# Patient Record
Sex: Female | Born: 1970 | Race: White | Hispanic: No | Marital: Single | Smoking: Current every day smoker
Health system: Southern US, Community
[De-identification: ages and names within clinical notes are randomized; demographics above are authoritative.]

## PROBLEM LIST (undated history)

## (undated) DIAGNOSIS — R519 Headache, unspecified: Secondary | ICD-10-CM

## (undated) DIAGNOSIS — K649 Unspecified hemorrhoids: Secondary | ICD-10-CM

## (undated) DIAGNOSIS — R600 Localized edema: Secondary | ICD-10-CM

## (undated) DIAGNOSIS — Z915 Personal history of self-harm: Secondary | ICD-10-CM

## (undated) DIAGNOSIS — K589 Irritable bowel syndrome without diarrhea: Secondary | ICD-10-CM

## (undated) DIAGNOSIS — I8390 Asymptomatic varicose veins of unspecified lower extremity: Secondary | ICD-10-CM

## (undated) DIAGNOSIS — M79673 Pain in unspecified foot: Secondary | ICD-10-CM

## (undated) DIAGNOSIS — Z9151 Personal history of suicidal behavior: Secondary | ICD-10-CM

## (undated) DIAGNOSIS — K219 Gastro-esophageal reflux disease without esophagitis: Secondary | ICD-10-CM

## (undated) DIAGNOSIS — F419 Anxiety disorder, unspecified: Secondary | ICD-10-CM

## (undated) DIAGNOSIS — A6000 Herpesviral infection of urogenital system, unspecified: Secondary | ICD-10-CM

## (undated) DIAGNOSIS — K4090 Unilateral inguinal hernia, without obstruction or gangrene, not specified as recurrent: Secondary | ICD-10-CM

## (undated) HISTORY — DX: Anxiety disorder, unspecified: F41.9

## (undated) HISTORY — DX: Unspecified hemorrhoids: K64.9

## (undated) HISTORY — DX: Gastro-esophageal reflux disease without esophagitis: K21.9

## (undated) HISTORY — DX: Asymptomatic varicose veins of unspecified lower extremity: I83.90

## (undated) HISTORY — DX: Irritable bowel syndrome without diarrhea: K58.9

## (undated) HISTORY — PX: BREAST BIOPSY: SHX20

## (undated) HISTORY — DX: Irritable bowel syndrome, unspecified: K58.9

## (undated) HISTORY — DX: Unilateral inguinal hernia, without obstruction or gangrene, not specified as recurrent: K40.90

---
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
## 1999-05-01 HISTORY — PX: VARICOSE VEIN SURGERY: SHX832

## 2009-04-30 DIAGNOSIS — K4090 Unilateral inguinal hernia, without obstruction or gangrene, not specified as recurrent: Secondary | ICD-10-CM

## 2009-04-30 HISTORY — PX: ABDOMINAL HYSTERECTOMY: SHX81

## 2009-04-30 HISTORY — DX: Unilateral inguinal hernia, without obstruction or gangrene, not specified as recurrent: K40.90

## 2009-08-30 ENCOUNTER — Ambulatory Visit: Payer: Self-pay | Admitting: General Surgery

## 2009-08-30 HISTORY — PX: HERNIA REPAIR: SHX51

## 2009-09-04 ENCOUNTER — Inpatient Hospital Stay: Payer: Self-pay | Admitting: Obstetrics & Gynecology

## 2009-10-01 ENCOUNTER — Emergency Department: Payer: Self-pay | Admitting: Internal Medicine

## 2009-10-17 ENCOUNTER — Emergency Department: Payer: Self-pay | Admitting: Internal Medicine

## 2012-02-11 IMAGING — RF DG SMALL BOWEL
1 series · 4 of 4 positions shown · non-contrast
Comparison: none

REASON FOR EXAM: Small bowel obstruction, limited study to assess small
bowel.
COMMENTS:

[Series 1: run · 4 of 4 slices shown]
[im 1/4]
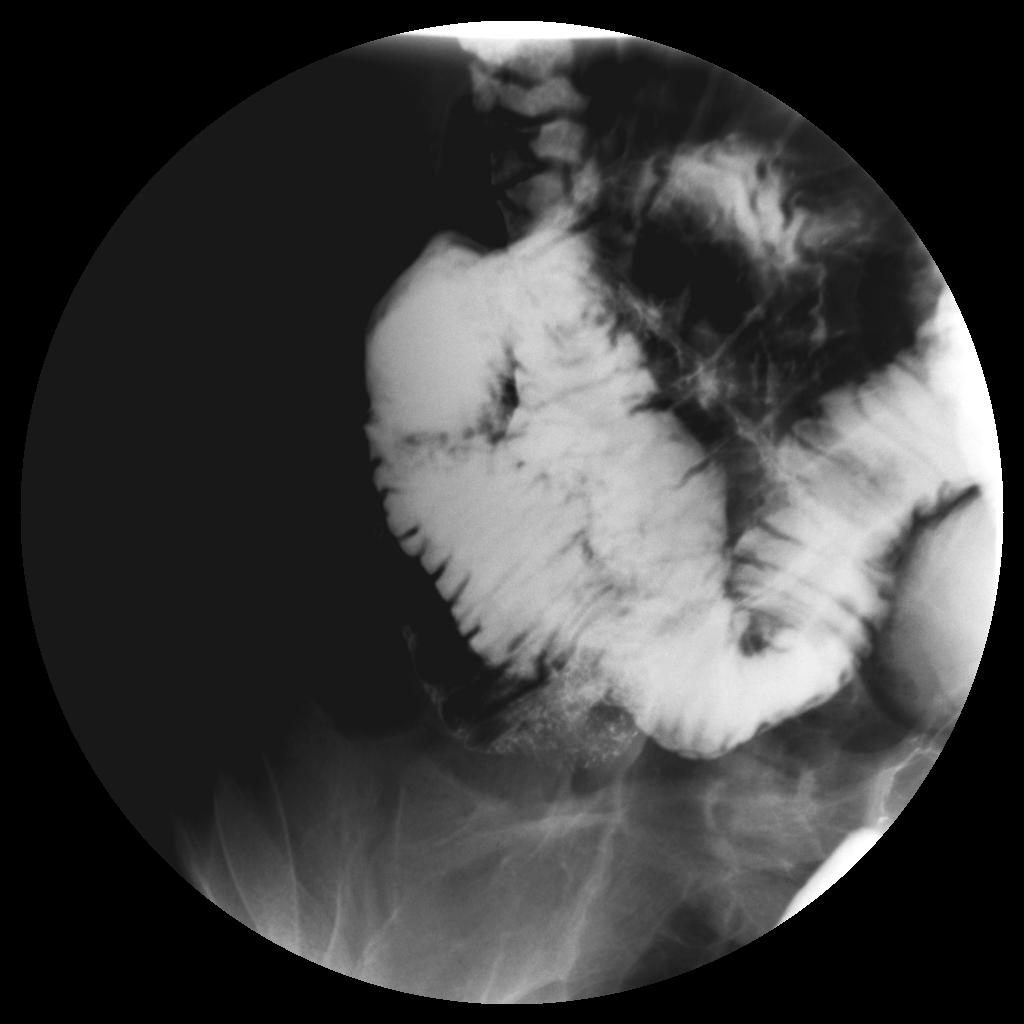
[im 2/4]
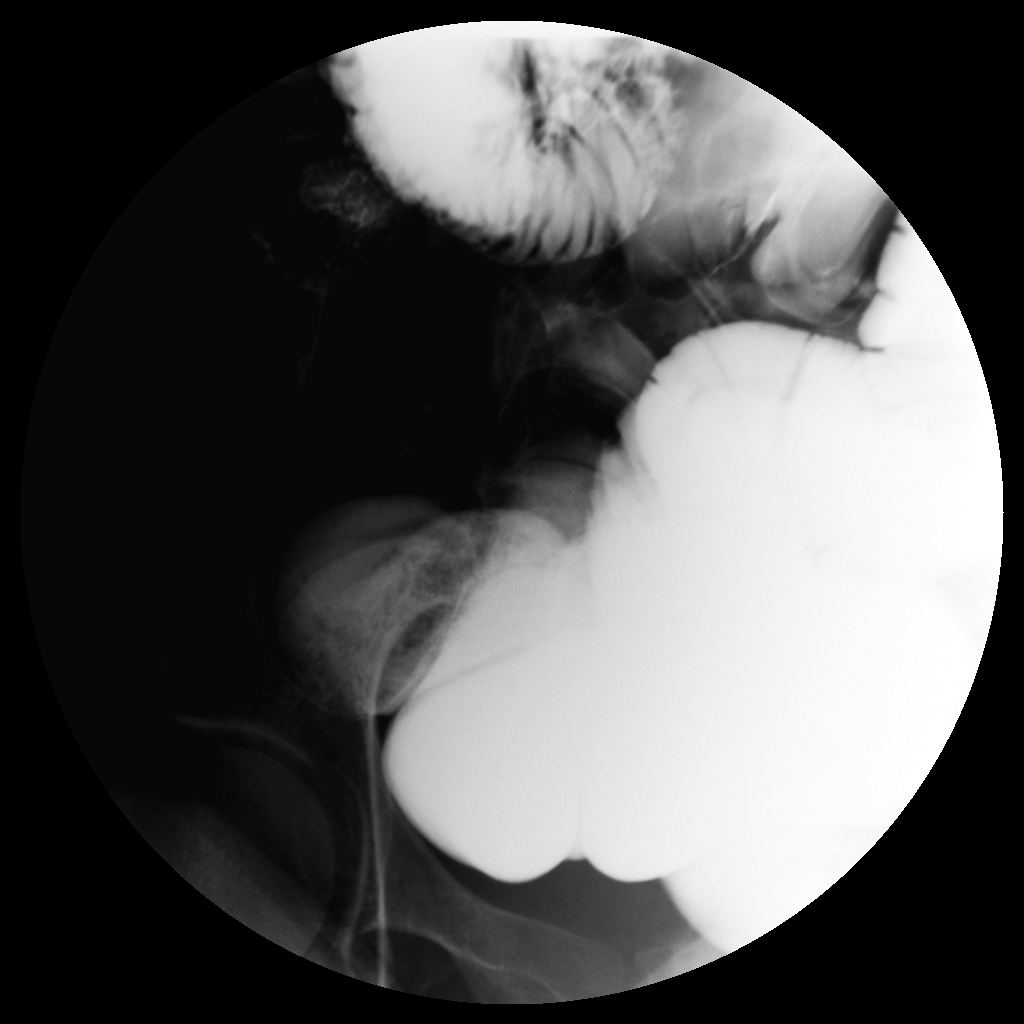
[im 3/4]
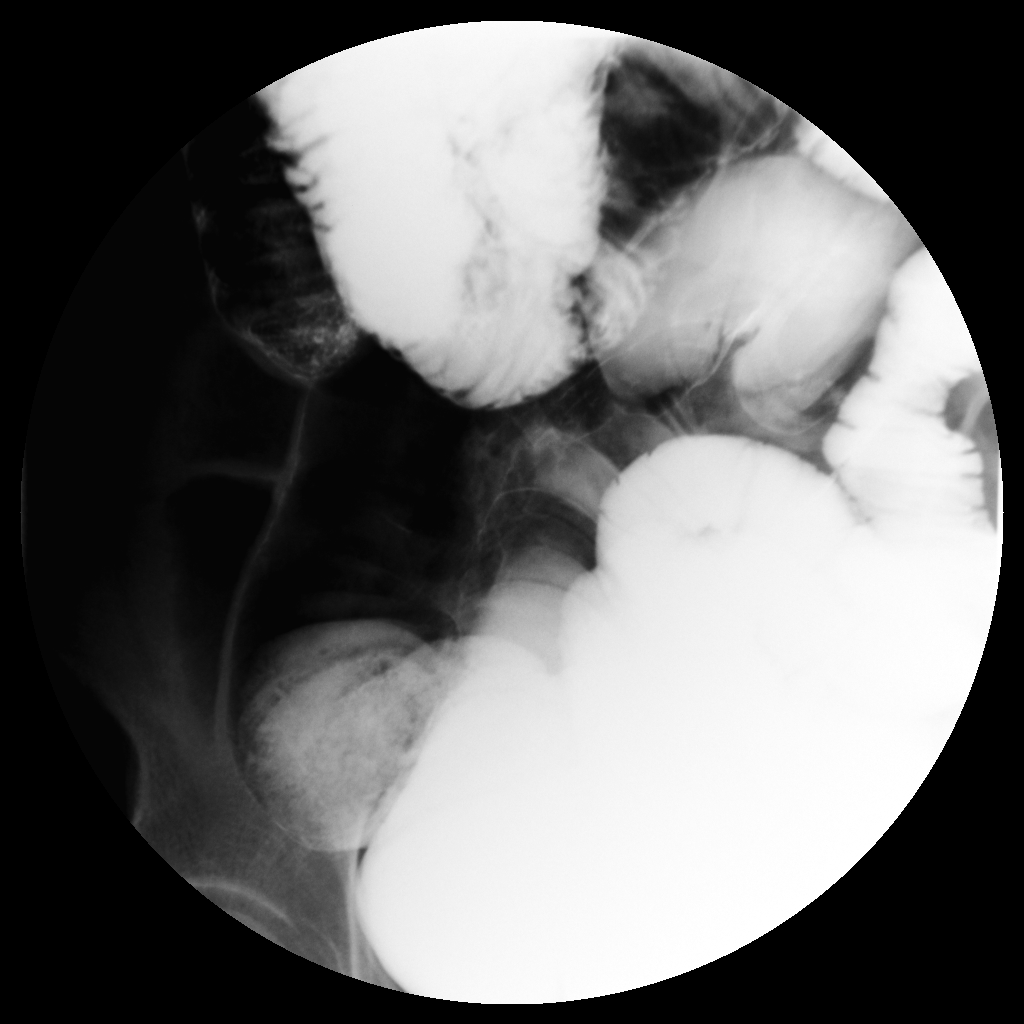
[im 4/4]
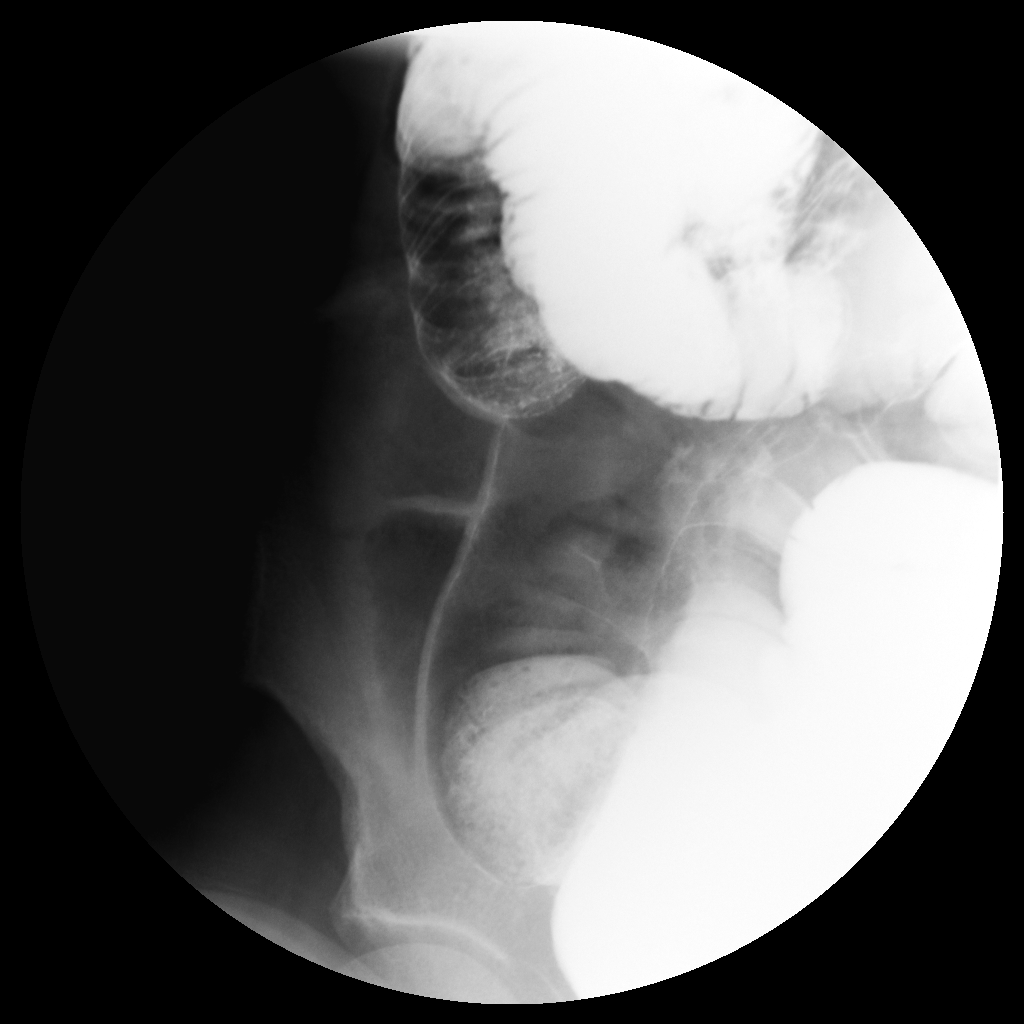

[4 of 4 positions shown; findings below may reference images not displayed]

PROCEDURE:     FL  - FL SMALL BOWEL  - September 08, 2009  [DATE]

RESULT:

The patient was given oral barium and the barium column was followed within
the small bowel utilizing fluoroscopic and plain film evaluation.

Evaluation of the scout image demonstrates multiple dilated loops of small
bowel and distended loops of large bowel which appear to be air-filled.

Status post administration of oral contrast the stomach fills with barium.
The duodenum demonstrates appropriate rotation and placement of the ligament
of Treitz.

Contrast is appreciated peristalsing within multiple distended and dilated
loops of small bowel. This study was halted after 6 hours of evaluation.
Contrast is appreciated in the region of the mid small bowel.
IMPRESSION: Findings consistent with high-grade partial obstruction,
mechanical versus a functional obstruction, considering the air within large
bowel. Continued surveillance imaging was obtained after this evaluation.

## 2012-11-11 ENCOUNTER — Ambulatory Visit: Payer: Self-pay | Admitting: Internal Medicine

## 2012-12-01 ENCOUNTER — Ambulatory Visit: Payer: Self-pay | Admitting: Internal Medicine

## 2013-01-12 ENCOUNTER — Ambulatory Visit: Payer: Self-pay | Admitting: Surgery

## 2013-07-15 ENCOUNTER — Ambulatory Visit: Payer: Self-pay | Admitting: Surgery

## 2013-08-15 ENCOUNTER — Emergency Department: Payer: Self-pay | Admitting: Emergency Medicine

## 2013-10-24 DIAGNOSIS — Z780 Asymptomatic menopausal state: Secondary | ICD-10-CM | POA: Insufficient documentation

## 2013-10-24 DIAGNOSIS — J309 Allergic rhinitis, unspecified: Secondary | ICD-10-CM | POA: Insufficient documentation

## 2013-11-30 ENCOUNTER — Ambulatory Visit: Payer: Self-pay | Admitting: Internal Medicine

## 2014-04-27 ENCOUNTER — Encounter: Payer: Self-pay | Admitting: General Surgery

## 2014-04-27 ENCOUNTER — Ambulatory Visit: Payer: Self-pay

## 2014-04-27 ENCOUNTER — Ambulatory Visit (INDEPENDENT_AMBULATORY_CARE_PROVIDER_SITE_OTHER): Payer: No Typology Code available for payment source | Admitting: General Surgery

## 2014-04-27 VITALS — BP 110/80 | HR 80 | Resp 14 | Ht 70.0 in | Wt 161.0 lb

## 2014-04-27 DIAGNOSIS — I8390 Asymptomatic varicose veins of unspecified lower extremity: Secondary | ICD-10-CM

## 2014-04-27 DIAGNOSIS — I868 Varicose veins of other specified sites: Secondary | ICD-10-CM

## 2014-04-27 NOTE — Progress Notes (Signed)
Patient ID: Angelica Hart Dobias, female   DOB: 1971-01-11, 43 y.o.   MRN: [id number]  Chief Complaint  Patient presents with  . Follow-up    evaluation of possible inguinal hernia    HPI Angelica Hart Uram is a 43 y.o. female who presents for an evaluation of a left inguinal hernia. She had this repaired in 2011. She states she noticed 2 bulges below the level of the inguinal ligament approximately 6 months ago. She describes the size to be approximately the size of a 50 cent piece as well as quarter size. They have not gotten larger in size. She has a pulling sensation in this area. No problems with bowels.  The patient is a active woman, going to the gym at least 3 times per week. No recent change in her exercise routine.  Prior to her 2011 surgery she reported increasing bulge in the area below the level of the inguinal ligament over 2 years. Exploration at the time of her hysterectomy did not show a femoral defect, and examination of the medial aspect of the inguinal canal suggested a vague weakness. An Atrium plug and patch mechanism was used at this area, placed from the original femoral incision below the inguinal ligament. An enlarged lymph node removed at the time of the surgery resulted in a small seroma that was aspirated on several occasions in the immediate postoperative period.   The patient has been aware of thickening along the inguinal canal since surgery, but has had no pain in this area except with vigorous direct pressure.  The patient had undergone treatment for varicose veins in the past by Stefanie LibelGregory Schneir, MD. She continues to make use of compressive garments.  HPI  Past Medical History  Diagnosis Date  . Varicose veins   . Inguinal hernia 2011  . IBS (irritable bowel syndrome)   . Hemorrhoids     Past Surgical History  Procedure Laterality Date  . Abdominal hysterectomy  2011    complete  . Varicose vein surgery  2001  . Hernia repair  08/30/2009    Left medial floor  weakness repaired with atrium plug and patch via femoral incision.    History reviewed. No pertinent family history.  Social History History  Substance Use Topics  . Smoking status: Current Every Day Smoker -- 0.50 packs/day for 25 years  . Smokeless tobacco: Never Used  . Alcohol Use: No    No Known Allergies  Current Outpatient Prescriptions  Medication Sig Dispense Refill  . ALLEGRA ALLERGY 180 MG tablet Take 180 mg by mouth daily.     Marland Kitchen. estradiol (ESTRACE) 1 MG tablet Take 1 mg by mouth daily.     . Multiple Vitamin (MULTIVITAMIN) tablet Take 1 tablet by mouth daily.    . nicotine (NICODERM CQ - DOSED IN MG/24 HOURS) 21 mg/24hr patch Place 21 mg onto the skin daily.     No current facility-administered medications for this visit.    Review of Systems Review of Systems  Constitutional: Negative.   Respiratory: Negative.   Cardiovascular: Negative.     Blood pressure 110/80, pulse 80, resp. rate 14, height 5\' 10"  (1.778 m), weight 161 lb (73.029 kg).  Physical Exam Physical Exam  Constitutional: She is oriented to person, place, and time. She appears well-developed and well-nourished.  Cardiovascular: Normal rate, regular rhythm and normal heart sounds.   No murmur heard. Pulmonary/Chest: Effort normal and breath sounds normal.  Abdominal: Soft. Normal appearance and bowel sounds are normal. There  is no hepatosplenomegaly. There is tenderness (along the left inguinal canal.).  Musculoskeletal:       Legs: Neurological: She is alert and oriented to person, place, and time.  Skin: Skin is warm and dry.    Data Reviewed Ultrasound examination of the left groin was undertaken. The "mass" identified by the patient is found to be a large varix off the saphenous vein. This shows flow with distal augmentation and complete compressibility with the ultrasound probe. The area enlarges during Valsalva maneuver.  Scanning along the inguinal canal shows changes consistent with  the previous mesh placement but no fluid collections.  Assessment    Proximal venous varicosity, no evidence of recurrent hernia.    Plan    The patient will make arrangements for reevaluation with the vascular service.    PCP:  Joette CatchingAnderson, Marshall W   Annete Ayuso W 04/28/2014, 7:20 AM

## 2014-04-27 NOTE — Patient Instructions (Signed)
The patient is aware to call back for any questions or concerns.  

## 2014-04-28 ENCOUNTER — Encounter: Payer: Self-pay | Admitting: General Surgery

## 2014-04-28 DIAGNOSIS — I8390 Asymptomatic varicose veins of unspecified lower extremity: Secondary | ICD-10-CM | POA: Insufficient documentation

## 2015-02-19 ENCOUNTER — Emergency Department: Payer: No Typology Code available for payment source

## 2015-02-19 ENCOUNTER — Emergency Department
Admission: EM | Admit: 2015-02-19 | Discharge: 2015-02-19 | Disposition: A | Discharge status: Home / Self Care | Payer: No Typology Code available for payment source | Attending: Emergency Medicine | Admitting: Emergency Medicine

## 2015-02-19 DIAGNOSIS — M722 Plantar fascial fibromatosis: Secondary | ICD-10-CM | POA: Diagnosis not present

## 2015-02-19 DIAGNOSIS — R519 Headache, unspecified: Secondary | ICD-10-CM

## 2015-02-19 DIAGNOSIS — Z79899 Other long term (current) drug therapy: Secondary | ICD-10-CM | POA: Diagnosis not present

## 2015-02-19 DIAGNOSIS — Z720 Tobacco use: Secondary | ICD-10-CM | POA: Insufficient documentation

## 2015-02-19 DIAGNOSIS — R51 Headache: Secondary | ICD-10-CM | POA: Diagnosis not present

## 2015-02-19 LAB — GLUCOSE, CAPILLARY: GLUCOSE-CAPILLARY: 85 mg/dL (ref 65–99)

## 2015-02-19 MED ORDER — METOCLOPRAMIDE HCL 5 MG/ML IJ SOLN
10.0000 mg | Freq: Once | INTRAMUSCULAR | Status: AC
  Administered 2015-02-19: 10 mg via INTRAVENOUS
  Filled 2015-02-19: qty 2

## 2015-02-19 MED ORDER — KETOROLAC TROMETHAMINE 30 MG/ML IJ SOLN
30.0000 mg | Freq: Once | INTRAMUSCULAR | Status: AC
  Administered 2015-02-19: 30 mg via INTRAVENOUS
  Filled 2015-02-19: qty 1

## 2015-02-19 NOTE — ED Notes (Signed)
Patient transported to CT 

## 2015-02-19 NOTE — Discharge Instructions (Signed)
You have been seen in the Emergency Department (ED) for a headache.  Please use Tylenol or Motrin as needed for symptoms, but only as written on the box.  ° °As we have discussed, please follow up with your primary care doctor as soon as possible regarding today’s Emergency Department (ED) visit and your headache symptoms.   ° °Call your doctor or return to the ED if you have a worsening headache, sudden and severe headache, confusion, slurred speech, facial droop, weakness or numbness in any arm or leg, extreme fatigue, vision problems, or other symptoms that concern you. ° ° °General Headache Without Cause °A headache is pain or discomfort felt around the head or neck area. The specific cause of a headache may not be found. There are many causes and types of headaches. A few common ones are: °· Tension headaches. °· Migraine headaches. °· Cluster headaches. °· Chronic daily headaches. °HOME CARE INSTRUCTIONS  °Watch your condition for any changes. Take these steps to help with your condition: °Managing Pain °· Take over-the-counter and prescription medicines only as told by your health care provider. °· Lie down in a dark, quiet room when you have a headache. °· If directed, apply ice to the head and neck area: °¨ Put ice in a plastic bag. °¨ Place a towel between your skin and the bag. °¨ Leave the ice on for 20 minutes, 2-3 times per day. °· Use a heating pad or hot shower to apply heat to the head and neck area as told by your health care provider. °· Keep lights dim if bright lights bother you or make your headaches worse. °Eating and Drinking °· Eat meals on a regular schedule. °· Limit alcohol use. °· Decrease the amount of caffeine you drink, or stop drinking caffeine. °General Instructions °· Keep all follow-up visits as told by your health care provider. This is important. °· Keep a headache journal to help find out what may trigger your headaches. For example, write down: °¨ What you eat and  drink. °¨ How much sleep you get. °¨ Any change to your diet or medicines. °· Try massage or other relaxation techniques. °· Limit stress. °· Sit up straight, and do not tense your muscles. °· Do not use tobacco products, including cigarettes, chewing tobacco, or e-cigarettes. If you need help quitting, ask your health care provider. °· Exercise regularly as told by your health care provider. °· Sleep on a regular schedule. Get 7-9 hours of sleep, or the amount recommended by your health care provider. °SEEK MEDICAL CARE IF:  °· Your symptoms are not helped by medicine. °· You have a headache that is different from the usual headache. °· You have nausea or you vomit. °· You have a fever. °SEEK IMMEDIATE MEDICAL CARE IF:  °· Your headache becomes severe. °· You have repeated vomiting. °· You have a stiff neck. °· You have a loss of vision. °· You have problems with speech. °· You have pain in the eye or ear. °· You have muscular weakness or loss of muscle control. °· You lose your balance or have trouble walking. °· You feel faint or pass out. °· You have confusion. °  °This information is not intended to replace advice given to you by your health care provider. Make sure you discuss any questions you have with your health care provider. °  °Document Released: 04/16/2005 Document Revised: 01/05/2015 Document Reviewed: 08/09/2014 °Elsevier Interactive Patient Education ©2016 [employer] ° °

## 2015-02-19 NOTE — ED Notes (Addendum)
Pt presents via EMS c/o headache and nausea. Pt started on hydrocodone yesterday for foot and ankle pain due to tendonitis. States went to bed last PM around midnight with headache and woke up this am with pressure like headache and nausea. Pt shaky upon assessment. Pt reports some SOB prior to arrival.

## 2015-02-19 NOTE — ED Provider Notes (Signed)
[facility] Emergency Department Provider Note REMINDER - THIS NOTE IS NOT A FINAL MEDICAL RECORD UNTIL IT IS SIGNED. UNTIL THEN, THE CONTENT BELOW MAY REFLECT INFORMATION FROM A DOCUMENTATION TEMPLATE, NOT THE ACTUAL PATIENT VISIT. ____________________________________________  Time seen: Approximately 10:48 AM  I have reviewed the triage vital signs and the nursing notes.   HISTORY  Chief Complaint Headache    HPI Angelica Hart is a 44 y.o. female reports a history of migraines for years, also recent diagnosis of tendinitis in her ankles and plantar fasciitis or she is placed on hydrocodone yesterday.  Patient reports that last night she started to slowly develop a throbbing frontal headache, this worsened throughout the evening. This morning when she woke up it feel like her headache which is continuing on is described as severe throbbing headache. No fevers or neck stiffness. She denies recent illness aside from tendinitis. She said her headache got so bad that she felt like she started having trouble breathing, and then had a "panic attack", for which she states her symptoms of shortness of breath or completely resolved now. No chest pain, no sharp chest pressure. No recent surgeries. She reports that right now she continues to have a moderate to severe throbbing headache.  The patient does report that she has a long-standing history of years of similar headaches, she's been previously treated with medications including what she thinks is Imitrex does not currently have any. She said this is a similar headache, not the worst headache of her life, and was not sudden in most severe at onset. Not associated with any numbness, tingling, weakness, vomiting, or trouble speaking.   Past Medical History  Diagnosis Date  . Varicose veins   . Inguinal hernia 2011  . IBS (irritable bowel syndrome)   . Hemorrhoids     Patient Active Problem List   Diagnosis Date  Noted  . Varicose vein 04/28/2014    Past Surgical History  Procedure Laterality Date  . Abdominal hysterectomy  2011    complete  . Varicose vein surgery  2001  . Hernia repair  08/30/2009    Left medial floor weakness repaired with atrium plug and patch via femoral incision.    Current Outpatient Rx  Name  Route  Sig  Dispense  Refill  . ALLEGRA ALLERGY 180 MG tablet   Oral   Take 180 mg by mouth daily.          Marland Kitchen. estradiol (ESTRACE) 1 MG tablet   Oral   Take 1 mg by mouth daily.          . Multiple Vitamin (MULTIVITAMIN) tablet   Oral   Take 1 tablet by mouth daily.         . nicotine (NICODERM CQ - DOSED IN MG/24 HOURS) 21 mg/24hr patch   Transdermal   Place 21 mg onto the skin daily.           Allergies Review of patient's allergies indicates no known allergies.  History reviewed. No pertinent family history.  Social History Social History  Substance Use Topics  . Smoking status: Current Every Day Smoker -- 0.50 packs/day for 25 years  . Smokeless tobacco: Never Used  . Alcohol Use: No    Review of Systems Constitutional: No fever/chills Eyes: No visual changes. ENT: No sore throat. Cardiovascular: Denies chest pain. Respiratory: Denies shortness of breath. Gastrointestinal: No abdominal pain.  No nausea, no vomiting.  No diarrhea.  No constipation. Genitourinary: Negative for  dysuria. Musculoskeletal: Negative for back pain. She does report pain in the bottom of her feet and also in the ankles which is been on for months but seems to be worsening. Diagnosis plantar fasciitis. Skin: Negative for rash. Neurological: Negative for  focal weakness or numbness.  She has presented taken hydrocodone without issue, she does report it makes her feel slightly loopy and that she will likely stop taking it altogether.  10-point ROS otherwise negative.  ____________________________________________   PHYSICAL EXAM:  VITAL SIGNS: ED Triage Vitals   Enc Vitals Group     BP 02/19/15 1021 104/79 mmHg     Pulse Rate 02/19/15 1021 57     Resp 02/19/15 1021 16     Temp 02/19/15 1021 97.8 F (36.6 C)     Temp Source 02/19/15 1021 Oral     SpO2 02/19/15 1021 100 %     Weight 02/19/15 1021 165 lb (74.844 kg)     Height 02/19/15 1021  (1.803 m)     Head Cir --      Peak Flow --      Pain Score 02/19/15 1022 8     Pain Loc --      Pain Edu? --      Excl. in GC? --    Constitutional: Alert and oriented. Well appearing and in no acute distress. Eyes: Conjunctivae are normal. PERRL. EOMI. Head: Atraumatic. Nose: No congestion/rhinnorhea. Mouth/Throat: Mucous membranes are moist.  Oropharynx non-erythematous. Neck: No stridor.  No meningismus. Cardiovascular: Normal rate, regular rhythm. Grossly normal heart sounds.  Good peripheral circulation. Respiratory: Normal respiratory effort.  No retractions. Lungs CTAB. Gastrointestinal: Soft and nontender. No distention. No abdominal bruits. No CVA tenderness. Musculoskeletal: No lower extremity tenderness nor edema except around the anterior ankles and across the left plantar fascia.  No joint effusions. Neurologic:  Normal speech and language. No gross focal neurologic deficits are appreciated. No pronator drift. Normal cranial nerves. No ataxia. Normal speech. No facial droop. Normal motor sensory in all extremities.  Skin:  Skin is warm, dry and intact. No rash noted. Psychiatric: Mood and affect are normal. Speech and behavior are normal.  ____________________________________________   LABS (all labs ordered are listed, but only abnormal results are displayed)  Labs Reviewed  GLUCOSE, CAPILLARY  CBG MONITORING, ED   ____________________________________________  EKG  ED ECG REPORT I, QUALE, MARK, the attending physician, personally viewed and interpreted this ECG.  Date: 02/19/2015 EKG Time: 1110 Rate: 55 Rhythm: normal sinus rhythm QRS Axis: normal Intervals:  normal ST/T Wave abnormalities: Evidence of J-point elevation with early repolarization pattern, no evidence of ST elevation MI Conduction Disutrbances: none Narrative Interpretation: Normal sinus rhythm, early repolarization  ____________________________________________  RADIOLOGY  CT HEAD WITHOUT CONTRAST  TECHNIQUE: Contiguous axial images were obtained from the base of the skull through the vertex without intravenous contrast.  COMPARISON: None.  FINDINGS: The ventricles and sulci are within normal limits for age. There is no evidence of acute infarct, intracranial hemorrhage, mass, midline shift, or extra-axial collection.  The visualized orbits are unremarkable. The visualized paranasal sinuses and mastoid air cells are clear. No skull fracture is identified.  IMPRESSION: Unremarkable head CT. ____________________________________________   PROCEDURES  Procedure(s) performed: None  Critical Care performed: No  ____________________________________________   INITIAL IMPRESSION / ASSESSMENT AND PLAN / ED COURSE  Pertinent labs & imaging results that were available during my care of the patient were reviewed by me and considered in my medical decision making (see  chart for details).  Patient presents for concerns of headache that was associated with feeling short of breath and "panic" which is now resolved. She continues to have a moderate headache, she seems well die no stinger recurrent pattern of headaches but reports never having had a CT or imaging of the brain given her on standing history of headaches will obtain imaging today to exclude significant etiologies such as mass or tumor. I will control her pain, no evidence of meningitis, acute infectious etiology, no sign of drug reaction, lungs are clear. No distress. We'll obtain EKG, pain control and further evaluation.  ----------------------------------------- 11:45 AM on  02/19/2015 -----------------------------------------  Patient reports all symptoms resolved. She is awake alert well-appearing area she has called her mother for a ride home. I reviewed discharge instructions and return precautions with the patient, she is agreeable with plan for discharge and understands return precautions. ____________________________________________   FINAL CLINICAL IMPRESSION(S) / ED DIAGNOSES  Final diagnoses:  Recurrent headache      Sharyn Creamer, MD 02/19/15 1146

## 2015-02-23 DIAGNOSIS — G43009 Migraine without aura, not intractable, without status migrainosus: Secondary | ICD-10-CM | POA: Insufficient documentation

## 2015-03-17 DIAGNOSIS — A6000 Herpesviral infection of urogenital system, unspecified: Secondary | ICD-10-CM | POA: Insufficient documentation

## 2015-04-16 IMAGING — MG MM CAD SCREENING MAMMO
3 series · 6 of 6 positions shown · non-contrast
Comparison: none

REASON FOR EXAM: SCR MAMMO NO ORDER
COMMENTS:

[R CC · right · 4 of 4 slices shown]
[im 1/4]
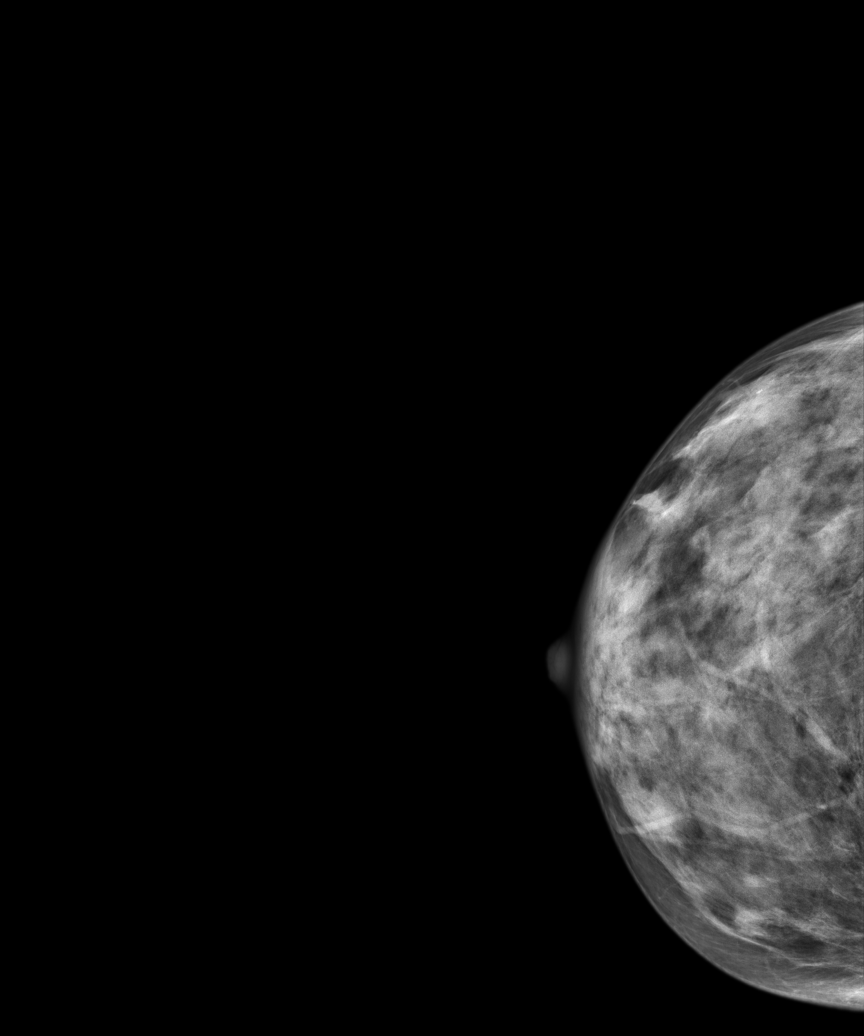
[im 2/4]
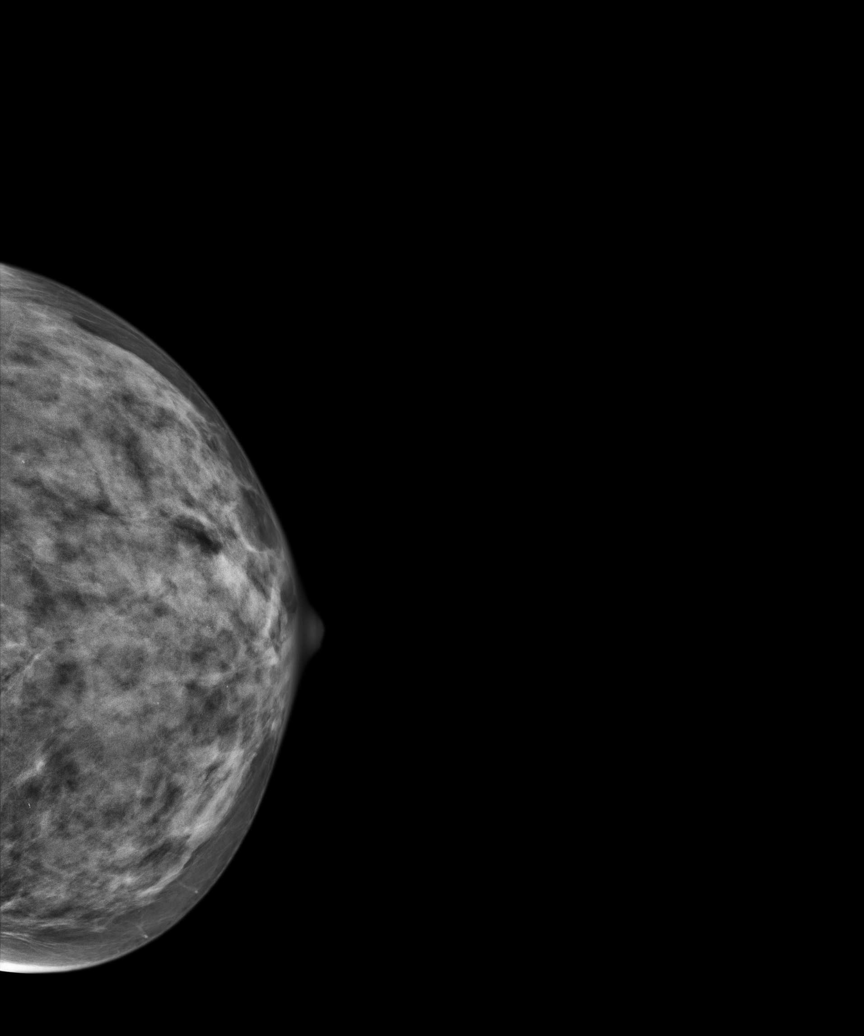
[im 3/4]
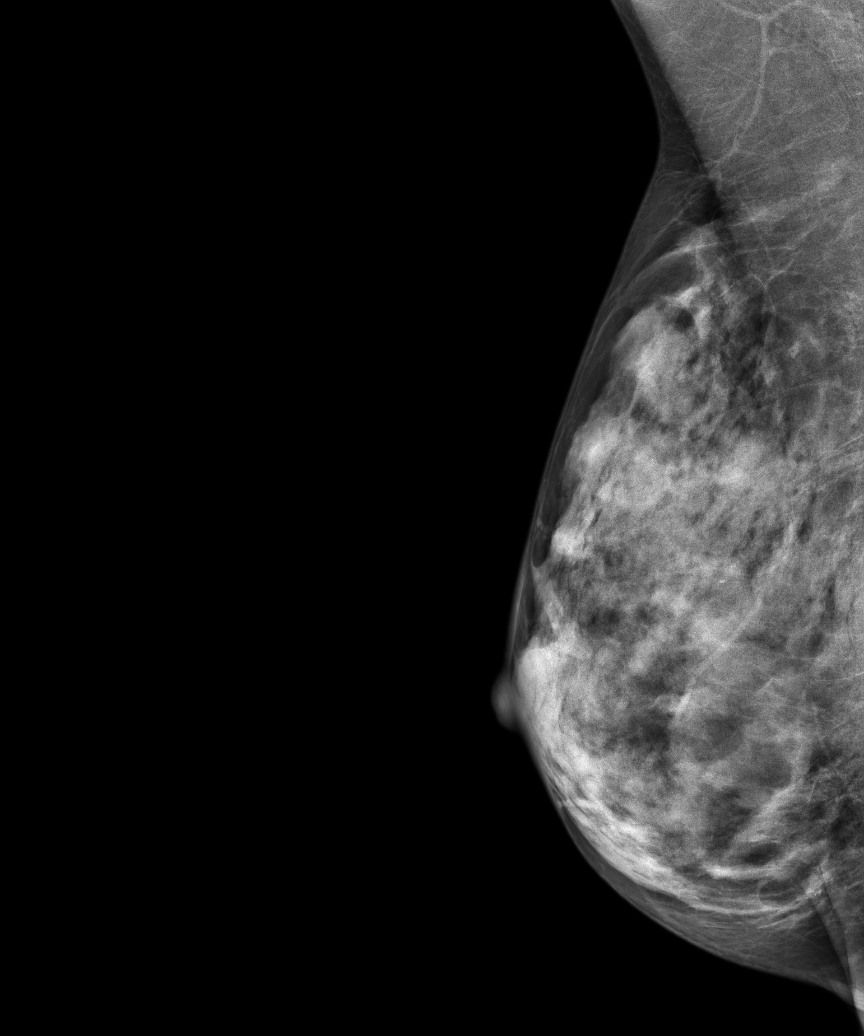
[im 4/4]
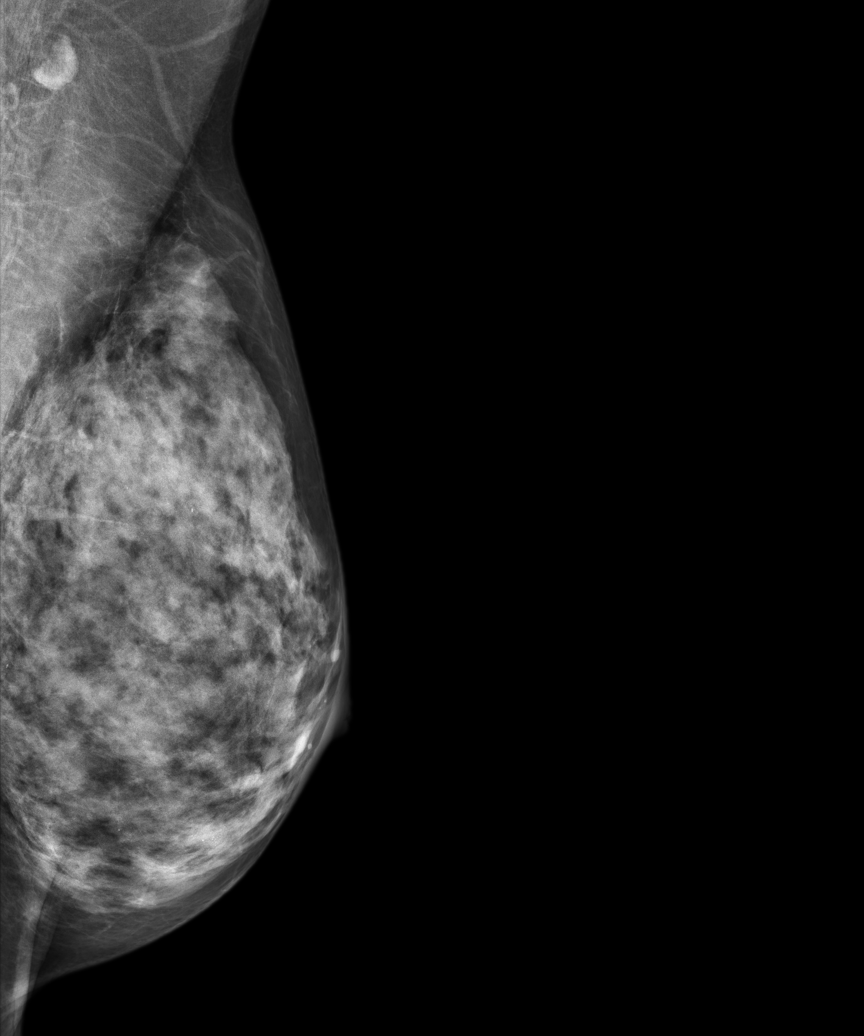

[L CC]
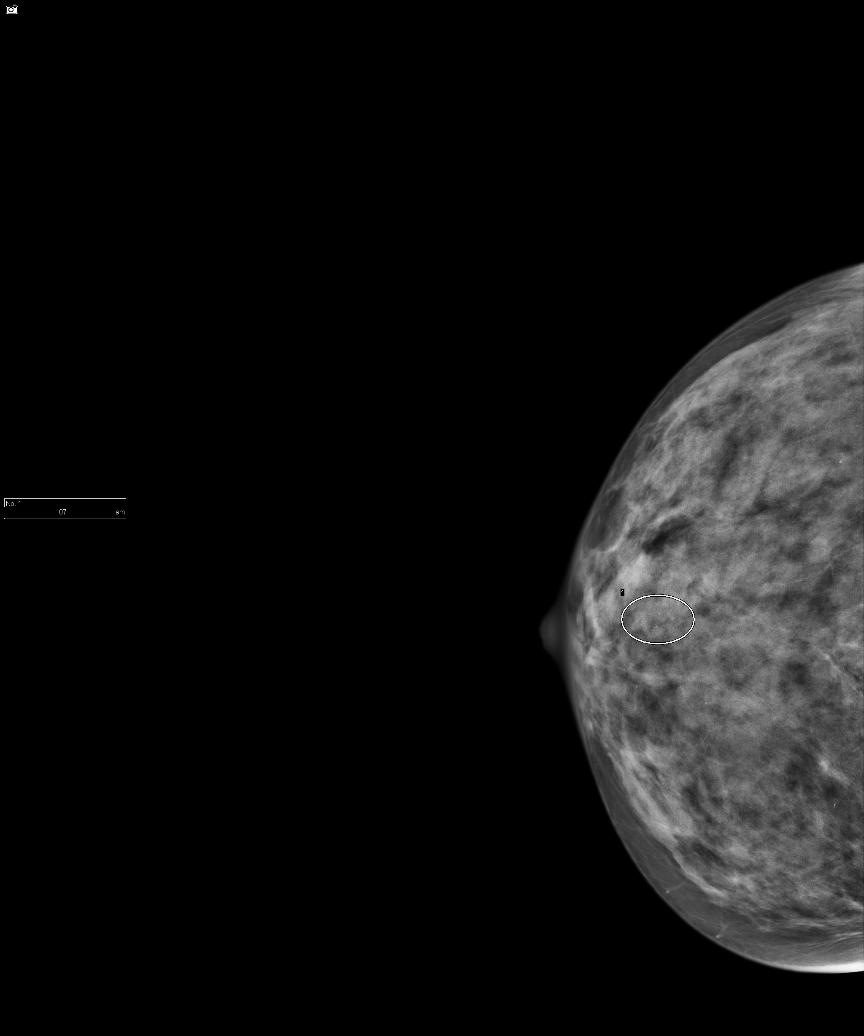

[L MLO]
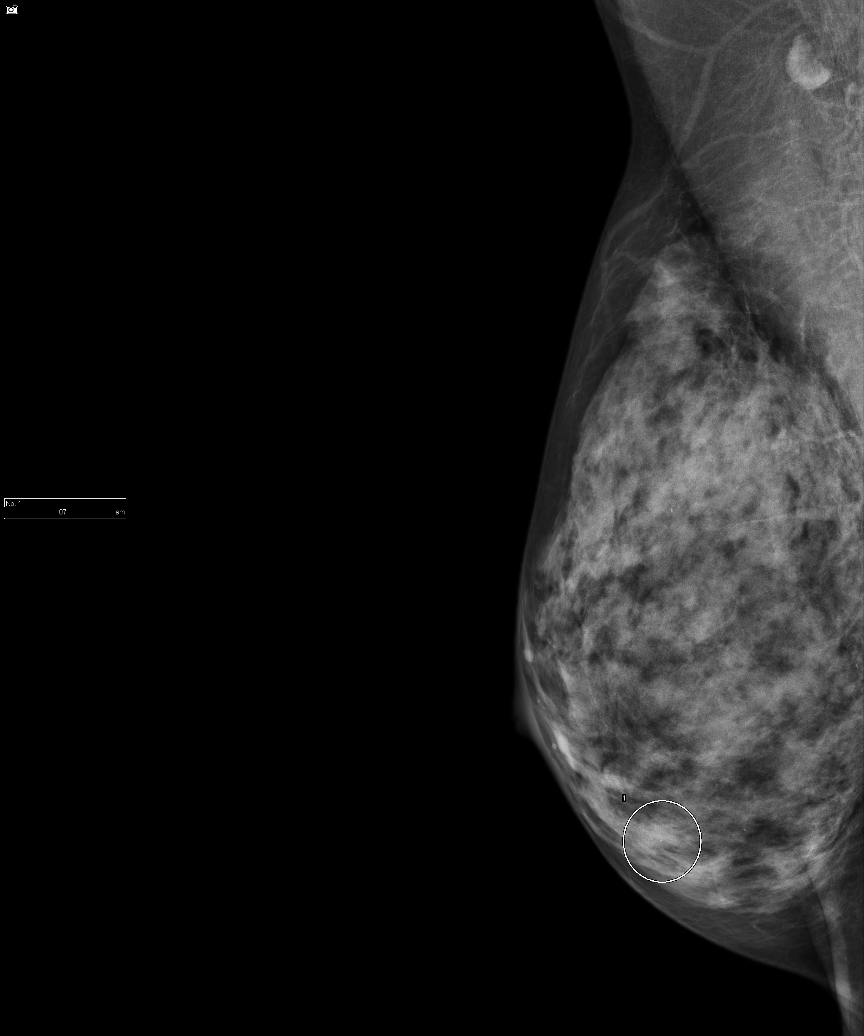

[6 of 6 positions shown; findings below may reference images not displayed]

PROCEDURE:     MAM - MAM DGTL SCRN MAM NO ORDER W/CAD  - November 11, 2012  [DATE]

RESULT:     Comparison is made to previous digital studies August 05, 2009,August 02, 2008, and July 17, 2006.

The breasts exhibit heterogeneously dense parenchymal pattern.
Benign-appearing lymph nodes are present in the left axillary region and
appear stable. There is no dominant mass and there are no malignant
appearing groupings of microcalcification. A faint lucent grouping of
indeterminate microcalcifications is noted in the lower aspect of the left
breast. These were not clearly evident on previous images but dense
parenchymal tissue could certainly obscure these microcalcifications. These
have been marked electronically and the images saved.
IMPRESSION: The breasts are heterogeneously dense. This limits the
sensitivity of mammography. There are faint microcalcifications in the
inferior aspect of the left breast which have been marked electronically and
the images saved. These merit further evaluation with spot magnification
views and a true lateral film.

BI-RADS 0: Additional workup o microcalcifications within the left breast
inferiorly is recommended.

BREAST COMPOSITION: The breast composition is HETEROGENEOUSLY DENSE
(glandular tissue is 51-75%) This may decrease the sensitivity of
mammography.

A NEGATIVE MAMMOGRAM REPORT DOES NOT PRECLUDE BIOPSY OR OTHER EVALUATION OF
A CLINICALLY PALPABLE OR OTHERWISE SUSPICIOUS MASS OR LESION. BREAST CANCER
MAY NOT BE DETECTED BY MAMMOGRAPHY IN UP TO 10% OF CASES.

[REDACTED]

## 2016-01-09 ENCOUNTER — Encounter: Payer: Self-pay | Admitting: Urology

## 2016-01-09 ENCOUNTER — Ambulatory Visit (INDEPENDENT_AMBULATORY_CARE_PROVIDER_SITE_OTHER): Payer: BLUE CROSS/BLUE SHIELD | Admitting: Urology

## 2016-01-09 VITALS — BP 114/78 | HR 85

## 2016-01-09 DIAGNOSIS — N390 Urinary tract infection, site not specified: Secondary | ICD-10-CM | POA: Diagnosis not present

## 2016-01-09 DIAGNOSIS — R350 Frequency of micturition: Secondary | ICD-10-CM | POA: Diagnosis not present

## 2016-01-09 LAB — URINALYSIS, COMPLETE
Bilirubin, UA: NEGATIVE
GLUCOSE, UA: NEGATIVE
KETONES UA: NEGATIVE
Leukocytes, UA: NEGATIVE
NITRITE UA: NEGATIVE
Protein, UA: NEGATIVE
RBC, UA: NEGATIVE
Specific Gravity, UA: 1.025 (ref 1.005–1.030)
UUROB: 0.2 mg/dL (ref 0.2–1.0)
pH, UA: 5 (ref 5.0–7.5)

## 2016-01-09 LAB — MICROSCOPIC EXAMINATION: BACTERIA UA: NONE SEEN

## 2016-01-09 LAB — BLADDER SCAN AMB NON-IMAGING: SCAN RESULT: 0

## 2016-01-09 MED ORDER — CEPHALEXIN 500 MG PO CAPS: 500.0000 mg | ORAL_CAPSULE | Freq: Once | ORAL | 3 refills | Status: AC

## 2016-01-09 NOTE — Progress Notes (Signed)
01/09/2016 3:34 PM   Angelica Hart 08/21/1970 [id number]  Referring provider: Lauro Regulus, MD [address] [facility] [place] [facility] [place], [place] [address]  Chief Complaint  Patient presents with  . Recurrent UTI    New Patient    HPI: 45 yo female with complaints of urine odor. She was otherwise asymptomatic. Urine cx grew e coli. Othertimes she gets frequency. Issues began after TAHBSO in 2010 and are made worse with sexual intercourse.  Abx seem to clear. No dysuria, flank pain or gross hematuria. UA clear today.  She's also been treated with metronidazole. She typically voids with a good stream. A post void today is normal. She is on estrogen replacement.   PMH: Past Medical History:  Diagnosis Date  . Anxiety   . GERD (gastroesophageal reflux disease)   . Hemorrhoids   . IBS (irritable bowel syndrome)   . Inguinal hernia 2011  . Varicose veins     Surgical History: Past Surgical History:  Procedure Laterality Date  . ABDOMINAL HYSTERECTOMY  2011   complete  . DILATION AND CURETTAGE OF UTERUS  1987  . HERNIA REPAIR  08/30/2009   Left medial floor weakness repaired with atrium plug and patch via femoral incision.  Marland Kitchen VARICOSE VEIN SURGERY  2001    Home Medications:    Medication List       Accurate as of 01/09/16  3:34 PM. Always use your most recent med list.          acyclovir 800 MG tablet Commonly known as:  ZOVIRAX TAKE 1 TABLET BY MOUTH TWICE A DAY FOR 10 DAYS IF FLARE UP ARISES   DULoxetine 60 MG capsule Commonly known as:  CYMBALTA TAKE 1 CAPSULE (60 MG TOTAL) BY MOUTH ONCE DAILY.   estradiol 1 MG tablet Commonly known as:  ESTRACE Take 1 mg by mouth daily.       Allergies: No Known Allergies  Family History: Family History  Problem Relation Age of Onset  . Hematuria Mother   . Prostate cancer Father     Social History:  reports that she has been smoking.  She has a 12.50 pack-year smoking history. She has never  used smokeless tobacco. She reports that she does not drink alcohol or use drugs.  ROS: UROLOGY Frequent Urination?: Yes Hard to postpone urination?: No Burning/pain with urination?: No Get up at night to urinate?: Yes Leakage of urine?: No Urine stream starts and stops?: No Trouble starting stream?: No Do you have to strain to urinate?: No Blood in urine?: No Urinary tract infection?: Yes Sexually transmitted disease?: Yes Injury to kidneys or bladder?: No Painful intercourse?: Yes Weak stream?: No Currently pregnant?: No Vaginal bleeding?: No Last menstrual period?: n  Gastrointestinal Nausea?: No Vomiting?: No Indigestion/heartburn?: Yes Diarrhea?: Yes Constipation?: Yes  Constitutional Fever: No Night sweats?: Yes Weight loss?: No Fatigue?: No  Skin Skin rash/lesions?: No Itching?: No  Eyes Blurred vision?: No Double vision?: No  Ears/Nose/Throat Sore throat?: No Sinus problems?: Yes  Hematologic/Lymphatic Swollen glands?: No Easy bruising?: Yes  Cardiovascular Leg swelling?: Yes Chest pain?: No  Respiratory Cough?: No Shortness of breath?: No  Endocrine Excessive thirst?: No  Musculoskeletal Back pain?: Yes Joint pain?: No  Neurological Headaches?: No Dizziness?: No  Psychologic Depression?: Yes Anxiety?: No  Physical Exam: BP 114/78   Pulse 85   Constitutional:  Alert and oriented, No acute distress. HEENT: [place] AT, moist mucus membranes.  Trachea midline, no masses. Cardiovascular: No  clubbing, cyanosis, or edema. Respiratory: Normal respiratory effort, no increased work of breathing. Skin: No rashes, bruises or suspicious lesions. Lymph: No cervical or inguinal adenopathy. Neurologic: Grossly intact, no focal deficits, moving all 4 extremities. Psychiatric: Normal mood and affect.  Laboratory Data: No results found for: WBC, HGB, HCT, MCV, PLT  No results found for: CREATININE  No results found for: PSA  No results  found for: TESTOSTERONE  No results found for: HGBA1C  Urinalysis No results found for: COLORURINE, APPEARANCEUR, LABSPEC, PHURINE, GLUCOSEU, HGBUR, BILIRUBINUR, KETONESUR, PROTEINUR, UROBILINOGEN, NITRITE, LEUKOCYTESUR   Assessment & Plan:   1. Recurrent UTI - some of these episodes sound asymptomatic (and not need abx tx) although some may be symptomatic. Will screen the kidneys with a renal ultrasound and she'll start post-coital cephalexin. Follow-up in 3 months for symptom check.    - Urinalysis, Complete - BLADDER SCAN AMB NON-IMAGING   No Follow-up on file.  Jerilee FieldESKRIDGE, Cru Kritikos, MD  [facility] [address], [address] [place], [place] [address] [phone]

## 2016-02-10 ENCOUNTER — Ambulatory Visit
Admission: RE | Admit: 2016-02-10 | Discharge: 2016-02-10 | Disposition: A | Discharge status: Home / Self Care | Payer: BLUE CROSS/BLUE SHIELD | Source: Ambulatory Visit | Attending: Urology | Admitting: Urology

## 2016-02-10 DIAGNOSIS — N390 Urinary tract infection, site not specified: Secondary | ICD-10-CM | POA: Insufficient documentation

## 2016-02-22 ENCOUNTER — Telehealth: Payer: Self-pay

## 2016-02-22 NOTE — Telephone Encounter (Signed)
Jerilee FieldMatthew Eskridge, MD  Skeet Latchhelsea C Watkins, LPN        Renal u/s was normal. Give result. F/u as planned.    LMOM- RUS normal. F/u as planned.

## 2016-04-09 ENCOUNTER — Ambulatory Visit: Payer: BLUE CROSS/BLUE SHIELD | Admitting: Urology

## 2016-08-10 ENCOUNTER — Encounter: Payer: Self-pay | Admitting: Emergency Medicine

## 2016-08-10 ENCOUNTER — Emergency Department: Payer: BLUE CROSS/BLUE SHIELD

## 2016-08-10 ENCOUNTER — Ambulatory Visit
Admission: EM | Admit: 2016-08-10 | Discharge: 2016-08-10 | Disposition: A | Discharge status: Other Acute Inpatient Hospital | Payer: No Typology Code available for payment source | Attending: Emergency Medicine | Admitting: Emergency Medicine

## 2016-08-10 ENCOUNTER — Emergency Department
Admission: EM | Admit: 2016-08-10 | Discharge: 2016-08-11 | Disposition: A | Discharge status: Home / Self Care | Payer: BLUE CROSS/BLUE SHIELD | Attending: Emergency Medicine | Admitting: Emergency Medicine

## 2016-08-10 DIAGNOSIS — R079 Chest pain, unspecified: Secondary | ICD-10-CM | POA: Insufficient documentation

## 2016-08-10 DIAGNOSIS — Z87891 Personal history of nicotine dependence: Secondary | ICD-10-CM | POA: Diagnosis not present

## 2016-08-10 LAB — TROPONIN I: Troponin I: <0.03 ng/mL (ref <0.03)

## 2016-08-10 LAB — BASIC METABOLIC PANEL
Anion gap: 4 — ABNORMAL LOW (ref 5–15)
BUN: 20 mg/dL (ref 6–20)
CALCIUM: 9 mg/dL (ref 8.9–10.3)
CHLORIDE: 104 mmol/L (ref 101–111)
CO2: 32 mmol/L (ref 22–32)
Creatinine, Ser: 0.85 mg/dL (ref 0.44–1.00)
GFR calc Af Amer: >60 mL/min (ref >60)
GFR calc non Af Amer: >60 mL/min (ref >60)
Glucose, Bld: 95 mg/dL (ref 65–99)
Potassium: 3.4 mmol/L — ABNORMAL LOW (ref 3.5–5.1)
SODIUM: 140 mmol/L (ref 135–145)

## 2016-08-10 LAB — CBC
HCT: 41.3 % (ref 35.0–47.0)
Hemoglobin: 13.9 g/dL (ref 12.0–16.0)
MCH: 31.7 pg (ref 26.0–34.0)
MCHC: 33.7 g/dL (ref 32.0–36.0)
MCV: 93.9 fL (ref 80.0–100.0)
PLATELETS: 160 10*3/uL (ref 150–440)
RBC: 4.4 MIL/uL (ref 3.80–5.20)
RDW: 12.7 % (ref 11.5–14.5)
WBC: 6.5 10*3/uL (ref 3.6–11.0)

## 2016-08-10 MED ORDER — ASPIRIN 81 MG PO CHEW
324.0000 mg | CHEWABLE_TABLET | Freq: Once | ORAL | Status: AC
  Administered 2016-08-10: 324 mg via ORAL

## 2016-08-10 NOTE — ED Notes (Signed)
EMS called to transport patient to [facility] ED 

## 2016-08-10 NOTE — ED Provider Notes (Signed)
CSN: [id number]     Arrival date & time 08/10/16  1920 History   None    Chief Complaint  Patient presents with  . Chest Pain   (Consider location/radiation/quality/duration/timing/severity/associated sxs/prior Treatment) The history is provided by the patient. No language interpreter was used.  Chest Pain  Pain location:  L chest Pain quality: dull and sharp   Pain radiates to:  Does not radiate Pain severity:  Moderate Onset quality:  Sudden Duration:  1 hour Timing:  Constant Progression:  Waxing and waning (5 current up to 8/10 when sharp apin hits, pt was driving when pain started) Chronicity:  New Context: at rest   Relieved by:  Nothing Worsened by:  Nothing Ineffective treatments:  None tried Associated symptoms: no diaphoresis, no fever, no palpitations and no shortness of breath   Risk factors comment:  Former smoker   Past Medical History:  Diagnosis Date  . Anxiety   . GERD (gastroesophageal reflux disease)   . Hemorrhoids   . IBS (irritable bowel syndrome)   . Inguinal hernia 2011  . Varicose veins    Past Surgical History:  Procedure Laterality Date  . ABDOMINAL HYSTERECTOMY  2011   complete  . DILATION AND CURETTAGE OF UTERUS  1987  . HERNIA REPAIR  08/30/2009   Left medial floor weakness repaired with atrium plug and patch via femoral incision.  Marland Kitchen VARICOSE VEIN SURGERY  2001   Family History  Problem Relation Age of Onset  . Hematuria Mother   . Prostate cancer Father    Social History  Substance Use Topics  . Smoking status: Former Smoker    Packs/day: 0.50    Years: 25.00  . Smokeless tobacco: Never Used  . Alcohol use No   OB History    Gravida Para Term Preterm AB Living   SAB TAB Ectopic Multiple Live Births   5              Obstetric Comments   1st Menstrual Cycle: 13 1st Pregnancy:  26      Review of Systems  Constitutional: Negative for diaphoresis and fever.  Respiratory: Negative for shortness of breath.    Cardiovascular: Positive for chest pain. Negative for palpitations.  All other systems reviewed and are negative.   Allergies  Patient has no known allergies.  Home Medications   Prior to Admission medications   Medication Sig Start Date End Date Taking? Authorizing Provider  DULoxetine (CYMBALTA) 60 MG capsule TAKE 1 CAPSULE (60 MG TOTAL) BY MOUTH ONCE DAILY. 12/22/15   Historical Provider, MD  estradiol (ESTRACE) 1 MG tablet Take 1 mg by mouth daily.  02/13/14   Historical Provider, MD   Meds Ordered and Administered this Visit   Medications  aspirin chewable tablet 324 mg (324 mg Oral Given 08/10/16 1933)    BP 122/66 (BP Location: Left Arm)   Pulse 89   Temp 97.8 F (36.6 C) (Oral)   Resp 16   Ht  (1.803 m)   Wt 175 lb (79.4 kg)   SpO2 100%   BMI 24.41 kg/m  No data found.   Physical Exam  Constitutional: She appears well-developed and well-nourished. She is active and cooperative.  Neck: No JVD present.  Cardiovascular: Normal rate, regular rhythm, normal heart sounds, intact distal pulses and normal pulses.   Pulmonary/Chest: Effort normal and breath sounds normal. No respiratory distress.  Neurological: She is alert. GCS eye subscore  is 4. GCS verbal subscore is 5. GCS motor subscore is 6.  Skin: Skin is warm and dry. Capillary refill takes 2 to 3 seconds.  Psychiatric: Her speech is normal and behavior is normal. Thought content normal. Her mood appears anxious.  Nursing note and vitals reviewed.   Urgent Care Course     Procedures (including critical care time)  Labs Review Labs Reviewed - No data to display  Imaging Review No results found.        MDM   1. Chest pain, unspecified type    Discussed plan of care with pt: will call EMS for CP transfer to [facility],pt verbalized understanding to this provider.. Pt given 4 baby ASA po in UC. EKG obtained at 1932 shows NSR rate 86, no t wave inversion no ectopy.   1955: Report given to Azalia Bilis, paramedic regarding pt status.   2000: Called report to Marcelino Duster, Press photographer at [facility], NP 08/10/16 2014

## 2016-08-10 NOTE — ED Triage Notes (Signed)
Pt to triage via EMS from [facility] for left sided chest pain.  Denies radiation, denies accompanying sx, denies risk factors.  Per EMS, CP started at 6:30pm and worse with palpation.  Pt nad at this time, resp equal and unlabored, skin warm and dry.

## 2016-08-10 NOTE — ED Triage Notes (Signed)
Patient c/o chest pain that started 1 hour ago.  Patient denies SOB, N/V.

## 2016-08-11 LAB — TROPONIN I

## 2016-08-11 MED ORDER — GI COCKTAIL ~~LOC~~
30.0000 mL | Freq: Once | ORAL | Status: AC
  Administered 2016-08-11: 30 mL via ORAL

## 2016-08-11 MED ORDER — GI COCKTAIL ~~LOC~~
ORAL | Status: AC
  Administered 2016-08-11: 30 mL via ORAL
  Filled 2016-08-11: qty 30

## 2016-08-11 NOTE — ED Provider Notes (Signed)
[facility] Emergency Department Provider Note   ____________________________________________   First MD Initiated Contact with Patient 08/11/16 0014     (approximate)  I have reviewed the triage vital signs and the nursing notes.   HISTORY  Chief Complaint Chest Pain    HPI Angelica Hart is a 46 y.o. female who comes over to the hospital today with some chest pain. The patient reports it started at 6:30 when she was driving. She reports that she gotten home ate some dinner and got back in her car to drive to church. The pain started on the left side of her chest. She reports that when she was at church the pain was coming and going. It was sharp and lasted about 30 minutes like that. The patient decided to go to urgent care but was sent here from urgent care. The patient reports that the pain is improved but she still has a little bit of soreness. She denies any shortness of breath, sweats, nausea, vomiting, dizziness, lightheadedness. The patient has never had this pain before. She doesn't think it's pulled muscle because she has had those before and it feels different. The patient is a history of IBS always has some abdominal pain but otherwise her pain is unremarkable. She is here today for evaluation.   Past Medical History:  Diagnosis Date  . Anxiety   . GERD (gastroesophageal reflux disease)   . Hemorrhoids   . IBS (irritable bowel syndrome)   . Inguinal hernia 2011  . Varicose veins     Patient Active Problem List   Diagnosis Date Noted  . 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 Migraine without aura and without status migrainosus, not intractable 02/23/2015  . Varicose vein 04/28/2014  . Allergic rhinitis 10/24/2013  . Menopause 10/24/2013    Past Surgical History:  Procedure Laterality Date  . ABDOMINAL HYSTERECTOMY  2011   complete  . DILATION AND CURETTAGE OF UTERUS  1987  . HERNIA REPAIR  08/30/2009   Left medial floor weakness repaired with  atrium plug and patch via femoral incision.  Marland Kitchen VARICOSE VEIN SURGERY  2001    Prior to Admission medications   Medication Sig Start Date End Date Taking? Authorizing Provider  DULoxetine (CYMBALTA) 60 MG capsule TAKE 1 CAPSULE (60 MG TOTAL) BY MOUTH ONCE DAILY. 12/22/15   Historical Provider, MD  estradiol (ESTRACE) 1 MG tablet Take 1 mg by mouth daily.  02/13/14   Historical Provider, MD    Allergies Patient has no known allergies.  Family History  Problem Relation Age of Onset  . Hematuria Mother   . Prostate cancer Father     Social History Social History  Substance Use Topics  . Smoking status: Former Smoker    Packs/day: 0.50    Years: 25.00  . Smokeless tobacco: Never Used  . Alcohol use No    Review of Systems Constitutional: No fever/chills Eyes: No visual changes. ENT: No sore throat. Cardiovascular:  chest pain. Respiratory: Denies shortness of breath. Gastrointestinal: No abdominal pain.  No nausea, no vomiting.  No diarrhea.  No constipation. Genitourinary: Negative for dysuria. Musculoskeletal: Negative for back pain. Skin: Negative for rash. Neurological: Negative for headaches, focal weakness or numbness.  10-point ROS otherwise negative.  ____________________________________________   PHYSICAL EXAM:  VITAL SIGNS: ED Triage Vitals  Enc Vitals Group     BP 08/10/16 2041 (!) 117/59     Pulse Rate 08/10/16 2041 74     Resp 08/10/16 2041 16  Temp 08/10/16 2041 98.8 F (37.1 C)     Temp Source 08/10/16 2041 Oral     SpO2 08/10/16 2041 100 %     Weight 08/10/16 2038 175 lb (79.4 kg)     Height 08/10/16 2038  (1.803 m)     Head Circumference --      Peak Flow --      Pain Score 08/10/16 2037 3     Pain Loc --      Pain Edu? --      Excl. in GC? --     Constitutional: Alert and oriented. Well appearing and in mild distress. Eyes: Conjunctivae are normal. PERRL. EOMI. Head: Atraumatic. Nose: No congestion/rhinnorhea. Mouth/Throat:  Mucous membranes are moist.  Oropharynx non-erythematous. Neck: No stridor.   Cardiovascular: Normal rate, regular rhythm. Grossly normal heart sounds.  Good peripheral circulation. Respiratory: Normal respiratory effort.  No retractions. Lungs CTAB. Gastrointestinal: Soft and nontender. No distention. Positive bowel sounds Musculoskeletal: No lower extremity tenderness nor edema.  Neurologic:  Normal speech and language. No gross focal neurologic deficits are appreciated. No gait instability. Skin:  Skin is warm, dry and intact. No rash noted. Psychiatric: Mood and affect are normal.  ____________________________________________   LABS (all labs ordered are listed, but only abnormal results are displayed)  Labs Reviewed  BASIC METABOLIC PANEL - Abnormal; Notable for the following:       Result Value   Potassium 3.4 (*)    Anion gap 4 (*)    All other components within normal limits  CBC  TROPONIN I  TROPONIN I   ____________________________________________  EKG  ED ECG REPORT I, Rebecka Apley, the attending physician, personally viewed and interpreted this ECG.   Date: 08/10/2016  EKG Time: 2038  Rate: 75  Rhythm: normal sinus rhythm  Axis: normal  Intervals:none  ST&T Change: none  ____________________________________________  RADIOLOGY  CXR ____________________________________________   PROCEDURES  Procedure(s) performed: None  Procedures  Critical Care performed: No  ____________________________________________   INITIAL IMPRESSION / ASSESSMENT AND PLAN / ED COURSE  Pertinent labs & imaging results that were available during my care of the patient were reviewed by me and considered in my medical decision making (see chart for details).  This is a 46 year old female who comes into the hospital today with chest pain. The patient reports that her pain is improved at this time but I will give her a GI cocktail. The patient had eaten before the  onset of her symptoms. She has no secondary symptoms as well as no medical history. The patient had initial blood work that was unremarkable by did repeat the patient's troponin. The patient's repeat troponin was also unremarkable. As the patient's pain is improved and her troponins are unremarkable and feels the patient can be discharged to home. I did explain to the patient though that she should follow-up with cardiology for stress test and further evaluation of her heart. The patient understands this. She'll be discharged home to follow-up.  Clinical Course as of Aug 11 124  Fri Aug 10, 2016  2343 No acute cardiopulmonary process seen. DG Chest 2 View [AW]    Clinical Course User Index [AW] Rebecka Apley, MD     ____________________________________________   FINAL CLINICAL IMPRESSION(S) / ED DIAGNOSES  Final diagnoses:  Chest pain, unspecified type      NEW MEDICATIONS STARTED DURING THIS VISIT:  New Prescriptions   No medications on file     Note:  This  document was prepared using Conservation officer, historic buildings and may include unintentional dictation errors.    Rebecka Apley, MD 08/11/16 [phone]

## 2016-08-11 NOTE — ED Notes (Signed)
Pt provided refreshments at this time, laughing with family in room. in NAD or discomfort at this time.

## 2016-08-11 NOTE — ED Notes (Signed)
Pt. Verbalizes understanding of d/c instructions and follow-up. VS stable and pain controlled per pt.  Pt. In NAD at time of d/c and denies further concerns regarding this visit. Pt. Stable at the time of departure from the unit, departing unit by the safest and most appropriate manner per that pt condition and limitations. Pt advised to return to the ED at any time for emergent concerns, or for new/worsening symptoms.   

## 2016-08-11 NOTE — Discharge Instructions (Signed)
Your workup is unremarkable at this time. Please follow up with cardiology for further evaluation of the heart. Should the pain worsen or he have any other complaints or concerns please return to the emergency department for evaluation.

## 2016-08-16 ENCOUNTER — Other Ambulatory Visit: Payer: Self-pay | Admitting: Internal Medicine

## 2016-08-16 DIAGNOSIS — Z1231 Encounter for screening mammogram for malignant neoplasm of breast: Secondary | ICD-10-CM

## 2016-08-28 ENCOUNTER — Encounter: Payer: Self-pay | Admitting: Cardiology

## 2016-08-28 ENCOUNTER — Ambulatory Visit (INDEPENDENT_AMBULATORY_CARE_PROVIDER_SITE_OTHER): Payer: BLUE CROSS/BLUE SHIELD | Admitting: Cardiology

## 2016-08-28 VITALS — BP 102/62 | HR 69 | Ht 71.0 in | Wt 177.8 lb

## 2016-08-28 DIAGNOSIS — R079 Chest pain, unspecified: Secondary | ICD-10-CM | POA: Diagnosis not present

## 2016-08-28 NOTE — Progress Notes (Signed)
Cardiology Office Note   Date:  08/28/2016   ID:  Angelica Hart, DOB 1970/08/08, MRN [id number]  Referring Doctor:  Lauro Regulus., MD   Cardiologist:   Almond Lint, MD   Reason for consultation:  Chief Complaint  Patient presents with  . other    hospital follow up for chest pain. Patient denies cheat pain and SOB. Meds reviewed verbally with patient.       History of Present Illness: Angelica Hart is a 46 y.o. female who is being seen today for the evaluation of Chest pain at the request of Lauro Regulus, MD.  Patient recalls one episode of chest pain 08/10/2016. She describes it as a feeling of muscle spasm on the left side of the chest, with intermittent sharp chest pain 8 out of 10 in severity, on and off for about an hour or so. She noticed this after the fall 10 hour day, rushing to get dinner at home, rush through dinner, and rushed to get to church. Never had this before. No recurrence since then. She was thinking it may have been related to digestive issue. She did not have any diaphoresis or shortness of breath or passing out sewer lightheadedness at that time. By the time she got to the ER, her pain has resolved. She was ruled out with 2 negative troponin levels.  She has continued to exercise and go to the gym 3 times a week, an hour each time. She does 30 minutes of aerobics, and 30 minutes of weight lifting. She has no chest pains or shortness of breath with exertion or with his physical activity.   ROS:  Please see the history of present illness. Aside from mentioned under HPI, all other systems are reviewed and negative.     Past Medical History:  Diagnosis Date  . Anxiety   . GERD (gastroesophageal reflux disease)   . Hemorrhoids   . IBS (irritable bowel syndrome)   . Inguinal hernia 2011  . Varicose veins     Past Surgical History:  Procedure Laterality Date  . ABDOMINAL HYSTERECTOMY  2011   complete  . DILATION AND CURETTAGE OF UTERUS   1987  . HERNIA REPAIR  08/30/2009   Left medial floor weakness repaired with atrium plug and patch via femoral incision.  Marland Kitchen VARICOSE VEIN SURGERY  2001     reports that she has quit smoking. She has a 12.50 pack-year smoking history. She has never used smokeless tobacco. She reports that she does not drink alcohol or use drugs.   family history includes Hematuria in her mother; Prostate cancer in her father.   Outpatient Medications Prior to Visit  Medication Sig Dispense Refill  . DULoxetine (CYMBALTA) 60 MG capsule TAKE 1 CAPSULE (60 MG TOTAL) BY MOUTH ONCE DAILY.  11  . estradiol (ESTRACE) 1 MG tablet Take 1 mg by mouth daily.      No facility-administered medications prior to visit.      Allergies: Patient has no known allergies.    PHYSICAL EXAM: VS:  BP 102/62 (BP Location: Right Arm, Patient Position: Sitting, Cuff Size: Normal)   Pulse 69   Ht  (1.803 m)   Wt 177 lb 12 oz (80.6 kg)   BMI 24.79 kg/m  , Body mass index is 24.79 kg/m. Wt Readings from Last 3 Encounters:  08/28/16 177 lb 12 oz (80.6 kg)  08/10/16 175 lb (79.4 kg)  08/10/16 175 lb (79.4 kg)    GENERAL:  well developed, well nourished, not in acute distress HEENT: normocephalic, pink conjunctivae, anicteric sclerae, no xanthelasma, normal dentition, oropharynx clear NECK:  no neck vein engorgement, JVP normal, no hepatojugular reflux, carotid upstroke brisk and symmetric, no bruit, no thyromegaly, no lymphadenopathy LUNGS:  good respiratory effort, clear to auscultation bilaterally CV:  PMI not displaced, no thrills, no lifts, S1 and S2 within normal limits, no palpable S3 or S4, no murmurs, no rubs, no gallops ABD:  Soft, nontender, nondistended, normoactive bowel sounds, no abdominal aortic bruit, no hepatomegaly, no splenomegaly MS: nontender back, no kyphosis, no scoliosis, no joint deformities EXT:  2+ DP/PT pulses, no edema, no varicosities, no cyanosis, no clubbing SKIN: warm, nondiaphoretic,  normal turgor, no ulcers NEUROPSYCH: alert, oriented to person, place, and time, sensory/motor grossly intact, normal mood, appropriate affect  Recent Labs: 08/10/2016: BUN 20; Creatinine, Ser 0.85; Hemoglobin 13.9; Platelets 160; Potassium 3.4; Sodium 140   Lipid Panel No results found for: CHOL, TRIG, HDL, CHOLHDL, VLDL, LDLCALC, LDLDIRECT   Other studies Reviewed:  EKG:  The ekg from 08/28/2016 was personally reviewed by me and it revealed sinus rhythm, 69 BPM, Overall normal.  Additional studies/ records that were reviewed personally reviewed by me today include: None available   ASSESSMENT AND PLAN: Chest pain [place] related to esophageal spasm, based on the historical context She has no exertional chest pain or shortness of breath She has no significant risk factors for CAD, no family history of premature CAD. Recommend echocardiogram  Tobacco use She is down to 6 cigarettes a day.We discussed the importance of smoking cessation and different strategies for quitting.   Current medicines are reviewed at length with the patient today.  The patient does not have concerns regarding medicines.  Labs/ tests ordered today include:  Orders Placed This Encounter  Procedures  . EKG 12-Lead  . ECHOCARDIOGRAM COMPLETE       Disposition:   FU with Cardiology after tests prn  Thank you for this consultation. We will forwarding this consultation to referring physician.   I spent at least 60 minutes with the patient today and more than 50% of the time was spent counseling the patient and coordinating care.    Signed, Almond Lint, MD  08/28/2016 12:42 PM    [facility]  This note was generated in part with voice recognition software and I apologize for any typographical errors that were not detected and corrected.

## 2016-08-28 NOTE — Patient Instructions (Addendum)
Testing/Procedures: Your physician has requested that you have an echocardiogram. Echocardiography is a painless test that uses sound waves to create images of your heart. It provides your doctor with information about the size and shape of your heart and how well your heart's chambers and valves are working. This procedure takes approximately one hour. There are no restrictions for this procedure.    Follow-Up: Your physician recommends that you schedule a follow-up appointment as needed. We will call you with results and if needed schedule follow up at that time.    It was a pleasure seeing you today here in the office. Please do not hesitate to give us a call back if you have any further questions. [phone]  Annelise Mccoy A. RN, BSN    Echocardiogram An echocardiogram, or echocardiography, uses sound waves (ultrasound) to produce an image of your heart. The echocardiogram is simple, painless, obtained within a short period of time, and offers valuable information to your health care provider. The images from an echocardiogram can provide information such as:  Evidence of coronary artery disease (CAD).  Heart size.  Heart muscle function.  Heart valve function.  Aneurysm detection.  Evidence of a past heart attack.  Fluid buildup around the heart.  Heart muscle thickening.  Assess heart valve function.  Tell a health care provider about:  Any allergies you have.  All medicines you are taking, including vitamins, herbs, eye drops, creams, and over-the-counter medicines.  Any problems you or family members have had with anesthetic medicines.  Any blood disorders you have.  Any surgeries you have had.  Any medical conditions you have.  Whether you are pregnant or may be pregnant. What happens before the procedure? No special preparation is needed. Eat and drink normally. What happens during the procedure?  In order to produce an image of your heart, gel will be  applied to your chest and a wand-like tool (transducer) will be moved over your chest. The gel will help transmit the sound waves from the transducer. The sound waves will harmlessly bounce off your heart to allow the heart images to be captured in real-time motion. These images will then be recorded.  You may need an IV to receive a medicine that improves the quality of the pictures. What happens after the procedure? You may return to your normal schedule including diet, activities, and medicines, unless your health care provider tells you otherwise. This information is not intended to replace advice given to you by your health care provider. Make sure you discuss any questions you have with your health care provider. Document Released: 04/13/2000 Document Revised: 12/03/2015 Document Reviewed: 12/22/2012 Elsevier Interactive Patient Education  2017 [employer].  

## 2016-09-04 ENCOUNTER — Ambulatory Visit
Admission: RE | Admit: 2016-09-04 | Discharge: 2016-09-04 | Disposition: A | Discharge status: Home / Self Care | Payer: BLUE CROSS/BLUE SHIELD | Source: Ambulatory Visit | Attending: Internal Medicine | Admitting: Internal Medicine

## 2016-09-04 DIAGNOSIS — Z1231 Encounter for screening mammogram for malignant neoplasm of breast: Secondary | ICD-10-CM | POA: Insufficient documentation

## 2016-09-04 DIAGNOSIS — R928 Other abnormal and inconclusive findings on diagnostic imaging of breast: Secondary | ICD-10-CM | POA: Insufficient documentation

## 2016-09-07 ENCOUNTER — Other Ambulatory Visit: Payer: Self-pay | Admitting: Internal Medicine

## 2016-09-07 DIAGNOSIS — R928 Other abnormal and inconclusive findings on diagnostic imaging of breast: Secondary | ICD-10-CM

## 2016-09-07 DIAGNOSIS — N6320 Unspecified lump in the left breast, unspecified quadrant: Secondary | ICD-10-CM

## 2016-10-01 ENCOUNTER — Ambulatory Visit
Admission: RE | Admit: 2016-10-01 | Discharge: 2016-10-01 | Disposition: A | Discharge status: Home / Self Care | Payer: BLUE CROSS/BLUE SHIELD | Source: Ambulatory Visit | Attending: Internal Medicine | Admitting: Internal Medicine

## 2016-10-01 DIAGNOSIS — R928 Other abnormal and inconclusive findings on diagnostic imaging of breast: Secondary | ICD-10-CM

## 2016-10-01 DIAGNOSIS — N6002 Solitary cyst of left breast: Secondary | ICD-10-CM | POA: Diagnosis not present

## 2016-10-01 DIAGNOSIS — N6320 Unspecified lump in the left breast, unspecified quadrant: Secondary | ICD-10-CM

## 2016-10-04 ENCOUNTER — Other Ambulatory Visit: Payer: Self-pay

## 2016-10-04 ENCOUNTER — Ambulatory Visit (INDEPENDENT_AMBULATORY_CARE_PROVIDER_SITE_OTHER): Payer: BLUE CROSS/BLUE SHIELD

## 2016-10-04 DIAGNOSIS — R079 Chest pain, unspecified: Secondary | ICD-10-CM

## 2016-10-09 ENCOUNTER — Other Ambulatory Visit: Payer: Self-pay | Admitting: Internal Medicine

## 2016-10-09 DIAGNOSIS — N6002 Solitary cyst of left breast: Secondary | ICD-10-CM

## 2017-04-04 ENCOUNTER — Ambulatory Visit
Admission: RE | Admit: 2017-04-04 | Discharge: 2017-04-04 | Disposition: A | Discharge status: Home / Self Care | Payer: BLUE CROSS/BLUE SHIELD | Source: Ambulatory Visit | Attending: Internal Medicine | Admitting: Internal Medicine

## 2017-04-04 DIAGNOSIS — N6002 Solitary cyst of left breast: Secondary | ICD-10-CM

## 2017-04-04 DIAGNOSIS — N6320 Unspecified lump in the left breast, unspecified quadrant: Secondary | ICD-10-CM | POA: Diagnosis present

## 2017-04-09 ENCOUNTER — Other Ambulatory Visit: Payer: Self-pay | Admitting: Internal Medicine

## 2017-04-09 DIAGNOSIS — N6320 Unspecified lump in the left breast, unspecified quadrant: Secondary | ICD-10-CM

## 2017-09-09 ENCOUNTER — Ambulatory Visit
Admission: RE | Admit: 2017-09-09 | Discharge: 2017-09-09 | Disposition: A | Discharge status: Home / Self Care | Payer: BLUE CROSS/BLUE SHIELD | Source: Ambulatory Visit | Attending: Internal Medicine | Admitting: Internal Medicine

## 2017-09-09 DIAGNOSIS — N6320 Unspecified lump in the left breast, unspecified quadrant: Secondary | ICD-10-CM

## 2018-04-29 ENCOUNTER — Other Ambulatory Visit: Payer: Self-pay | Admitting: Student

## 2018-04-29 DIAGNOSIS — N6310 Unspecified lump in the right breast, unspecified quadrant: Secondary | ICD-10-CM

## 2018-04-29 DIAGNOSIS — N644 Mastodynia: Secondary | ICD-10-CM

## 2018-05-07 ENCOUNTER — Other Ambulatory Visit: Payer: BLUE CROSS/BLUE SHIELD

## 2019-04-17 ENCOUNTER — Other Ambulatory Visit: Payer: BLUE CROSS/BLUE SHIELD

## 2019-04-21 ENCOUNTER — Other Ambulatory Visit: Payer: Self-pay | Admitting: Podiatry

## 2019-04-22 ENCOUNTER — Other Ambulatory Visit: Payer: Self-pay

## 2019-04-22 ENCOUNTER — Encounter
Admission: RE | Admit: 2019-04-22 | Discharge: 2019-04-22 | Disposition: A | Discharge status: Home / Self Care | Payer: BLUE CROSS/BLUE SHIELD | Source: Ambulatory Visit | Attending: Podiatry | Admitting: Podiatry

## 2019-04-22 ENCOUNTER — Encounter: Payer: Self-pay | Admitting: Podiatry

## 2019-04-22 HISTORY — DX: Pain in unspecified foot: M79.673

## 2019-04-22 NOTE — Pre-Procedure Instructions (Signed)
Secure chat with Dr Cleda Mccreedy and notified office regarding need for orders.

## 2019-04-22 NOTE — Pre-Procedure Instructions (Signed)
Incentive spirometry and carbohydrate given along with instructions. 

## 2019-04-22 NOTE — Patient Instructions (Addendum)
Your procedure is scheduled on: 04/28/2019 Tues Report to Same Day Surgery 2nd floor medical mall [facility] Entrance-take elevator on left to 2nd floor.  Check in with surgery information desk.) To find out your arrival time please call [phone] between 1PM - 3PM on 04/27/2019 Mon  Remember: Instructions that are not followed completely may result in serious medical risk, up to and including death, or upon the discretion of your surgeon and anesthesiologist your surgery may need to be rescheduled.    _x___ 1. Do not eat food after midnight the night before your procedure. You may drink clear liquids up to 2 hours before you are scheduled to arrive at the hospital for your procedure.  Do not drink clear liquids within 2 hours of your scheduled arrival to the hospital.  Clear liquids include  --Water or Apple juice without pulp  --Clear carbohydrate beverage such as ClearFast or Gatorade  --Black Coffee or Clear Tea (No milk, no creamers, do not add anything to                  the coffee or Tea Type 1 and type 2 diabetics should only drink water.   ____Ensure clear carbohydrate drink on the way to the hospital for bariatric patients  _x___Ensure clear carbohydrate drink 3 hours before surgery. Complete drink 2 hours before coming to hospital.   No gum chewing or hard candies.     __x__ 2. No Alcohol for 24 hours before or after surgery.   __x__3. No Smoking or e-cigarettes for 24 prior to surgery.  Do not use any chewable tobacco products for at least 6 hour prior to surgery   ____  4. Bring all medications with you on the day of surgery if instructed.    __x__ 5. Notify your doctor if there is any change in your medical condition     (cold, fever, infections).    x___6. On the morning of surgery brush your teeth with toothpaste and water.  You may rinse your mouth with mouth wash if you wish.  Do not swallow any toothpaste or mouthwash.   Do not wear jewelry, make-up,  hairpins, clips or nail polish.  Do not wear lotions, powders, or perfumes. You may wear deodorant.  Do not shave 48 hours prior to surgery. Men may shave face and neck.  Do not bring valuables to the hospital.    [facility] is not responsible for any belongings or valuables.               Contacts, dentures or bridgework may not be worn into surgery.  Leave your suitcase in the car. After surgery it may be brought to your room.  For patients admitted to the hospital, discharge time is determined by your                       treatment team.  _  Patients discharged the day of surgery will not be allowed to drive home.  You will need someone to drive you home and stay with you the night of your procedure.    Please read over the following fact sheets that you were given:   [facility] Preparing for Surgery and or MRSA Information   _x___ Take anti-hypertensive listed below, cardiac, seizure, asthma,     anti-reflux and psychiatric medicines. These include:  1. DULoxetine (CYMBALTA) 60 MG capsule  2.estradiol (ESTRACE) 1 MG tablet  3.  4.  5.  6.  ____Fleets enema or Magnesium Citrate as directed.   _x___ Use CHG Soap or sage wipes as directed on instruction sheet   ____ Use inhalers on the day of surgery and bring to hospital day of surgery  ____ Stop Metformin and Janumet 2 days prior to surgery.    ____ Take 1/2 of usual insulin dose the night before surgery and none on the morning     surgery.   _x___ Follow recommendations from Cardiologist, Pulmonologist or PCP regarding          stopping Aspirin, Coumadin, Plavix ,Eliquis, Effient, or Pradaxa, and Pletal.  X____Stop Anti-inflammatories such as Advil, Aleve, Ibuprofen, Motrin, Naproxen, Naprosyn, Goodies powders or aspirin products. OK to take Tylenol and                          Celebrex.   _x___ Stop supplements until after surgery.  But may continue Vitamin D, Vitamin B,       and multivitamin.   ____ Bring C-Pap to  the hospital.

## 2019-04-27 ENCOUNTER — Other Ambulatory Visit: Payer: Self-pay

## 2019-04-27 ENCOUNTER — Other Ambulatory Visit
Admission: RE | Admit: 2019-04-27 | Discharge: 2019-04-27 | Disposition: A | Discharge status: Home / Self Care | Payer: BLUE CROSS/BLUE SHIELD | Source: Ambulatory Visit | Attending: Podiatry | Admitting: Podiatry

## 2019-04-27 DIAGNOSIS — Z20828 Contact with and (suspected) exposure to other viral communicable diseases: Secondary | ICD-10-CM | POA: Insufficient documentation

## 2019-04-27 DIAGNOSIS — Z01812 Encounter for preprocedural laboratory examination: Secondary | ICD-10-CM | POA: Insufficient documentation

## 2019-04-27 LAB — SARS CORONAVIRUS 2 (TAT 6-24 HRS): SARS Coronavirus 2: NEGATIVE

## 2019-04-27 MED ORDER — CEFAZOLIN SODIUM-DEXTROSE 2-4 GM/100ML-% IV SOLN
2.0000 g | INTRAVENOUS | Status: AC
  Administered 2019-04-28: 08:00:00 2 g via INTRAVENOUS

## 2019-04-28 ENCOUNTER — Ambulatory Visit
Admission: RE | Admit: 2019-04-28 | Discharge: 2019-04-28 | Disposition: A | Discharge status: Home / Self Care | Payer: BLUE CROSS/BLUE SHIELD | Attending: Podiatry | Admitting: Podiatry

## 2019-04-28 ENCOUNTER — Ambulatory Visit: Payer: BLUE CROSS/BLUE SHIELD | Admitting: Anesthesiology

## 2019-04-28 ENCOUNTER — Encounter: Payer: Self-pay | Admitting: Podiatry

## 2019-04-28 ENCOUNTER — Encounter
Admission: RE | Disposition: A | Discharge status: Home / Self Care | Payer: Self-pay | Source: Home / Self Care | Attending: Podiatry

## 2019-04-28 ENCOUNTER — Other Ambulatory Visit: Payer: Self-pay

## 2019-04-28 DIAGNOSIS — F17210 Nicotine dependence, cigarettes, uncomplicated: Secondary | ICD-10-CM | POA: Diagnosis not present

## 2019-04-28 DIAGNOSIS — F419 Anxiety disorder, unspecified: Secondary | ICD-10-CM | POA: Insufficient documentation

## 2019-04-28 DIAGNOSIS — M2041 Other hammer toe(s) (acquired), right foot: Secondary | ICD-10-CM | POA: Diagnosis not present

## 2019-04-28 DIAGNOSIS — Z79899 Other long term (current) drug therapy: Secondary | ICD-10-CM | POA: Diagnosis not present

## 2019-04-28 DIAGNOSIS — Z885 Allergy status to narcotic agent status: Secondary | ICD-10-CM | POA: Insufficient documentation

## 2019-04-28 DIAGNOSIS — M899 Disorder of bone, unspecified: Secondary | ICD-10-CM | POA: Insufficient documentation

## 2019-04-28 HISTORY — PX: BONE EXCISION: SHX6730

## 2019-04-28 SURGERY — BONE EXCISION
Anesthesia: General | Laterality: Right

## 2019-04-28 MED ORDER — FAMOTIDINE 20 MG PO TABS
ORAL_TABLET | ORAL | Status: AC
  Administered 2019-04-28: 06:00:00 20 mg via ORAL
  Filled 2019-04-28: qty 1

## 2019-04-28 MED ORDER — FENTANYL CITRATE (PF) 100 MCG/2ML IJ SOLN
25.0000 ug | INTRAMUSCULAR | Status: DC | PRN
  Administered 2019-04-28: 25 ug via INTRAVENOUS

## 2019-04-28 MED ORDER — ACETAMINOPHEN 325 MG PO TABS: 650.0000 mg | ORAL_TABLET | Freq: Once | ORAL | Status: AC

## 2019-04-28 MED ORDER — POVIDONE-IODINE 7.5 % EX SOLN: Freq: Once | CUTANEOUS | Status: DC

## 2019-04-28 MED ORDER — LIDOCAINE HCL (CARDIAC) PF 100 MG/5ML IV SOSY
PREFILLED_SYRINGE | INTRAVENOUS | Status: DC | PRN
  Administered 2019-04-28: 60 mg via INTRAVENOUS

## 2019-04-28 MED ORDER — ONDANSETRON HCL 4 MG/2ML IJ SOLN
INTRAMUSCULAR | Status: AC
  Filled 2019-04-28: qty 2

## 2019-04-28 MED ORDER — ONDANSETRON HCL 4 MG/2ML IJ SOLN
INTRAMUSCULAR | Status: DC | PRN
  Administered 2019-04-28: 4 mg via INTRAVENOUS

## 2019-04-28 MED ORDER — PROPOFOL 10 MG/ML IV BOLUS
INTRAVENOUS | Status: AC
  Filled 2019-04-28: qty 20

## 2019-04-28 MED ORDER — FAMOTIDINE 20 MG PO TABS: 20.0000 mg | ORAL_TABLET | Freq: Once | ORAL | Status: AC

## 2019-04-28 MED ORDER — DEXAMETHASONE SODIUM PHOSPHATE 10 MG/ML IJ SOLN
INTRAMUSCULAR | Status: AC
  Filled 2019-04-28: qty 1

## 2019-04-28 MED ORDER — BUPIVACAINE HCL (PF) 0.5 % IJ SOLN
INTRAMUSCULAR | Status: DC | PRN
  Administered 2019-04-28: 9 mL

## 2019-04-28 MED ORDER — BUPIVACAINE HCL (PF) 0.5 % IJ SOLN
INTRAMUSCULAR | Status: AC
  Filled 2019-04-28: qty 30

## 2019-04-28 MED ORDER — PROPOFOL 10 MG/ML IV BOLUS
INTRAVENOUS | Status: DC | PRN
  Administered 2019-04-28: 150 mg via INTRAVENOUS
  Administered 2019-04-28: 50 mg via INTRAVENOUS

## 2019-04-28 MED ORDER — MIDAZOLAM HCL 2 MG/2ML IJ SOLN
INTRAMUSCULAR | Status: AC
  Filled 2019-04-28: qty 2

## 2019-04-28 MED ORDER — CEFAZOLIN SODIUM-DEXTROSE 2-4 GM/100ML-% IV SOLN
INTRAVENOUS | Status: AC
  Filled 2019-04-28: qty 100

## 2019-04-28 MED ORDER — ONDANSETRON HCL 4 MG/2ML IJ SOLN: 4.0000 mg | Freq: Once | INTRAMUSCULAR | Status: DC | PRN

## 2019-04-28 MED ORDER — MIDAZOLAM HCL 2 MG/2ML IJ SOLN
INTRAMUSCULAR | Status: DC | PRN
  Administered 2019-04-28: 2 mg via INTRAVENOUS

## 2019-04-28 MED ORDER — LACTATED RINGERS IV SOLN: INTRAVENOUS | Status: DC

## 2019-04-28 MED ORDER — FENTANYL CITRATE (PF) 100 MCG/2ML IJ SOLN
INTRAMUSCULAR | Status: AC
  Filled 2019-04-28: qty 2

## 2019-04-28 MED ORDER — HYDROCODONE-ACETAMINOPHEN 5-325 MG PO TABS: 1.0000 | ORAL_TABLET | ORAL | 0 refills | Status: AC | PRN

## 2019-04-28 MED ORDER — DEXAMETHASONE SODIUM PHOSPHATE 10 MG/ML IJ SOLN
INTRAMUSCULAR | Status: DC | PRN
  Administered 2019-04-28: 10 mg via INTRAVENOUS

## 2019-04-28 MED ORDER — LIDOCAINE HCL (PF) 2 % IJ SOLN
INTRAMUSCULAR | Status: AC
  Filled 2019-04-28: qty 10

## 2019-04-28 MED ORDER — FENTANYL CITRATE (PF) 100 MCG/2ML IJ SOLN
INTRAMUSCULAR | Status: AC
  Administered 2019-04-28: 25 ug via INTRAVENOUS
  Filled 2019-04-28: qty 2

## 2019-04-28 MED ORDER — FENTANYL CITRATE (PF) 100 MCG/2ML IJ SOLN
INTRAMUSCULAR | Status: DC | PRN
  Administered 2019-04-28 (×4): 25 ug via INTRAVENOUS

## 2019-04-28 MED ORDER — ACETAMINOPHEN 325 MG PO TABS
ORAL_TABLET | ORAL | Status: AC
  Administered 2019-04-28: 09:00:00 650 mg via ORAL
  Filled 2019-04-28: qty 2

## 2019-04-28 MED ORDER — OXYCODONE HCL 5 MG PO TABS: 5.0000 mg | ORAL_TABLET | ORAL | 0 refills | Status: AC | PRN

## 2019-04-28 MED ORDER — CHLORHEXIDINE GLUCONATE 4 % EX LIQD
60.0000 mL | Freq: Once | CUTANEOUS | Status: AC
  Administered 2019-04-28: 4 via TOPICAL

## 2019-04-28 SURGICAL SUPPLY — 42 items
BLADE OSC/SAGITTAL MD 5.5X18 (BLADE) ×2 IMPLANT
BLADE SURG 15 STRL LF DISP TIS (BLADE) IMPLANT
BLADE SURG 15 STRL SS (BLADE)
BLADE SURG MINI STRL (BLADE) IMPLANT
BNDG CONFORM 2 STRL LF (GAUZE/BANDAGES/DRESSINGS) ×2 IMPLANT
BNDG ELASTIC 4X5.8 VLCR NS LF (GAUZE/BANDAGES/DRESSINGS) ×2 IMPLANT
BNDG ESMARK 4X12 TAN STRL LF (GAUZE/BANDAGES/DRESSINGS) ×2 IMPLANT
CANISTER SUCT 1200ML W/VALVE (MISCELLANEOUS) ×2 IMPLANT
COVER WAND RF STERILE (DRAPES) ×2 IMPLANT
CUFF TOURN SGL QUICK 12 (TOURNIQUET CUFF) IMPLANT
CUFF TOURN SGL QUICK 18X4 (TOURNIQUET CUFF) ×2 IMPLANT
DRAPE FLUOR MINI C-ARM 54X84 (DRAPES) ×2 IMPLANT
DURAPREP 26ML APPLICATOR (WOUND CARE) ×2 IMPLANT
ELECT REM PT RETURN 9FT ADLT (ELECTROSURGICAL) ×2
ELECTRODE REM PT RTRN 9FT ADLT (ELECTROSURGICAL) ×1 IMPLANT
GAUZE SPONGE 4X4 12PLY STRL (GAUZE/BANDAGES/DRESSINGS) ×2 IMPLANT
GAUZE XEROFORM 1X8 LF (GAUZE/BANDAGES/DRESSINGS) ×2 IMPLANT
GLOVE BIO SURGEON STRL SZ7.5 (GLOVE) ×4 IMPLANT
GLOVE INDICATOR 8.0 STRL GRN (GLOVE) IMPLANT
GOWN STRL REUS W/ TWL LRG LVL3 (GOWN DISPOSABLE) ×2 IMPLANT
GOWN STRL REUS W/TWL LRG LVL3 (GOWN DISPOSABLE) ×2
KIT TURNOVER KIT A (KITS) ×2 IMPLANT
LABEL OR SOLS (LABEL) ×2 IMPLANT
NEEDLE FILTER BLUNT 18X 1/2SAF (NEEDLE) ×1
NEEDLE FILTER BLUNT 18X1 1/2 (NEEDLE) ×1 IMPLANT
NEEDLE HYPO 25X1 1.5 SAFETY (NEEDLE) ×4 IMPLANT
NS IRRIG 500ML POUR BTL (IV SOLUTION) ×2 IMPLANT
PACK EXTREMITY ARMC (MISCELLANEOUS) ×2 IMPLANT
PAD CAST CTTN 4X4 STRL (SOFTGOODS) IMPLANT
PADDING CAST COTTON 4X4 STRL (SOFTGOODS)
RASP SM TEAR CROSS CUT (RASP) ×2 IMPLANT
SOL PREP PVP 2OZ (MISCELLANEOUS) ×2
SOLUTION PREP PVP 2OZ (MISCELLANEOUS) ×1 IMPLANT
SPLINT FAST PLASTER 5X30 (CAST SUPPLIES)
SPLINT PLASTER CAST FAST 5X30 (CAST SUPPLIES) IMPLANT
STOCKINETTE STRL 6IN 960660 (GAUZE/BANDAGES/DRESSINGS) ×2 IMPLANT
STRIP CLOSURE SKIN 1/4X4 (GAUZE/BANDAGES/DRESSINGS) ×2 IMPLANT
SUT ETHILON 5-0 FS-2 18 BLK (SUTURE) ×2 IMPLANT
SUT VIC AB 4-0 FS2 27 (SUTURE) ×2 IMPLANT
SYR 10ML LL (SYRINGE) ×2 IMPLANT
WIRE Z .035 C-WIRE SPADE TIP (WIRE) IMPLANT
WIRE Z .062 C-WIRE SPADE TIP (WIRE) IMPLANT

## 2019-04-28 NOTE — Transfer of Care (Signed)
Immediate Anesthesia Transfer of Care Note  Patient: Angelica Hart  Procedure(s) Performed: PARTIAL EXCISION BONE PHALANX (Right )  Patient Location: PACU  Anesthesia Type:General  Level of Consciousness: sedated  Airway & Oxygen Therapy: Patient Spontanous Breathing and Patient connected to face mask oxygen  Post-op Assessment: Report given to RN and Post -op Vital signs reviewed and stable  Post vital signs: Reviewed and stable  Last Vitals:  Vitals Value Taken Time  BP 108/58 04/28/19 0820  Temp 36.7 C 04/28/19 0820  Pulse 79 04/28/19 0823  Resp 25 04/28/19 0823  SpO2 97 % 04/28/19 0823  Vitals shown include unvalidated device data.  Last Pain:  Vitals:   04/28/19 0820  TempSrc:   PainSc: (P) Asleep         Complications: No apparent anesthesia complications

## 2019-04-28 NOTE — Op Note (Signed)
Date of operation: 04/28/2019.  Surgeon: Durward Fortes D.P.M.  Preoperative diagnosis: Hammertoe with exostosis right fifth toe.  Postoperative diagnosis: Same.  Procedure: Arthroplasty with partial excision bone right fifth toe.  Anesthesia: LMA with local.  Hemostasis: Pneumatic tourniquet right ankle 250 mmHg.  Estimated blood loss: Less than 5 cc.  Pathology: None.  Complications: None apparent.  Operative indications: This is a 48 year old female with a chronic history of a painful hammertoe with corn on her right fifth toe.  Patient elects for surgical correction of her deformity.  Operative procedure: Patient was taken to the operating room and placed on the table in the supine position.  Following satisfactory LMA anesthesia the right foot was anesthetized with 9 cc of 0.5% Marcaine plain around the right fifth metatarsal and toe.  A pneumatic tourniquet was applied at the level of the right ankle and the foot was prepped and draped in the usual sterile fashion.  The foot was exsanguinated and the tourniquet inflated to 250 mmHg.      Attention was then directed to the dorsal aspect of the right fifth toe where an approximate 1.5 cm linear incision was made coursing proximal to distal over the toe.  Incision was carried deep down to the level of the tendon where a transverse tenotomy was performed and the capsular and periosteal tissues reflected off of the head of the proximal phalanx which was then incised and removed in toto using a sagittal saw.  The medial eminence on the base of the distal phalanx was then freed from the surrounding tissues and resected using a sagittal saw.  Intraoperative FluoroScan views revealed good reduction of the bony deformity.  The wound was flushed with copious amounts of sterile saline and closed using 4-0 Vicryl interrupted suture for tendon reapproximation followed by skin closure using 5-0 nylon simple interrupted sutures.  Xeroform 4 x 4's and con  form applied to the right foot.  Tourniquet was released and blood flow noted return immediately to the right foot and all digits.  Kerlix and an Ace wrap then applied to the right lower extremity.  Patient was awakened and transported to the PACU with vital signs stable and in good condition.

## 2019-04-28 NOTE — Anesthesia Post-op Follow-up Note (Signed)
Anesthesia QCDR form completed.        

## 2019-04-28 NOTE — Anesthesia Postprocedure Evaluation (Signed)
Anesthesia Post Note  Patient: Angelica Hart  Procedure(s) Performed: PARTIAL EXCISION BONE PHALANX (Right )  Patient location during evaluation: PACU Anesthesia Type: General Level of consciousness: awake and alert Pain management: pain level controlled Vital Signs Assessment: post-procedure vital signs reviewed and stable Respiratory status: spontaneous breathing, nonlabored ventilation, respiratory function stable and patient connected to nasal cannula oxygen Cardiovascular status: blood pressure returned to baseline and stable Postop Assessment: no apparent nausea or vomiting Anesthetic complications: no     Last Vitals:  Vitals:   04/28/19 0920 04/28/19 0923  BP: (!) 108/59   Pulse: 63 72  Resp: 13 (!) 23  Temp: 36.5 C   SpO2: 92% 94%    Last Pain:  Vitals:   04/28/19 0920  TempSrc:   PainSc: 2                  Precious Haws Lilliauna Van

## 2019-04-28 NOTE — Interval H&P Note (Signed)
History and Physical Interval Note:  04/28/2019 7:12 AM  Angelica Hart  has presented today for surgery, with the diagnosis of M89.8X9 EXOSTOSIS M20.41 HAMMERTOE.  The various methods of treatment have been discussed with the patient and family. After consideration of risks, benefits and other options for treatment, the patient has consented to  Procedure(s): PARTIAL EXCISION BONE PHALANX (Right) as a surgical intervention.  The patient's history has been reviewed, patient examined, no change in status, stable for surgery.  I have reviewed the patient's chart and labs.  Questions were answered to the patient's satisfaction.     Durward Fortes

## 2019-04-28 NOTE — Anesthesia Preprocedure Evaluation (Signed)
Anesthesia Evaluation  Patient identified by MRN, date of birth, ID band Patient awake    Reviewed: Allergy & Precautions, NPO status , Patient's Chart, lab work & pertinent test results  History of Anesthesia Complications Negative for: history of anesthetic complications  Airway Mallampati: II       Dental   Pulmonary neg sleep apnea, neg COPD, Current Smoker and Patient abstained from smoking.,           Cardiovascular (-) hypertension(-) Past MI and (-) CHF (-) dysrhythmias (-) Valvular Problems/Murmurs     Neuro/Psych neg Seizures Anxiety    GI/Hepatic Neg liver ROS, GERD  Medicated,  Endo/Other  neg diabetes  Renal/GU negative Renal ROS     Musculoskeletal   Abdominal   Peds  Hematology   Anesthesia Other Findings   Reproductive/Obstetrics                             Anesthesia Physical Anesthesia Plan  ASA: II  Anesthesia Plan: General   Post-op Pain Management:    Induction: Intravenous  PONV Risk Score and Plan: 2 and Ondansetron and Dexamethasone  Airway Management Planned: LMA  Additional Equipment:   Intra-op Plan:   Post-operative Plan:   Informed Consent: I have reviewed the patients History and Physical, chart, labs and discussed the procedure including the risks, benefits and alternatives for the proposed anesthesia with the patient or authorized representative who has indicated his/her understanding and acceptance.       Plan Discussed with:   Anesthesia Plan Comments:         Anesthesia Quick Evaluation

## 2019-04-28 NOTE — H&P (Signed)
Subjective: Patient presents today for surgery on the right fifth toe.  Chronic problems with a hammertoe with painful corn.  Objective: Neurovascular status is intact.  Skin hyperkeratotic area with no evidence of ulceration on the right fifth toe.  Chronic contracture of the fifth toe with palpable exostosis and some lateral deviation distally.   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  Plan: Medical history and physical in the chart was reviewed.  Patient is stable for planned surgery this morning.

## 2019-04-28 NOTE — Discharge Instructions (Addendum)
1.  Elevate the right lower extremity on pillows.  2.  Keep the bandage on the right foot clean, dry, and do not remove.  3.  Sponge bathe only right lower extremity.  4.  Wear surgical shoe on the right foot whenever walking or standing.  5.  Take 1 pain pill, Norco, every 4 hours only if needed for pain.    AMBULATORY SURGERY  DISCHARGE INSTRUCTIONS   1) The drugs that you were given will stay in your system until tomorrow so for the next 24 hours you should not:  A) Drive an automobile B) Make any legal decisions C) Drink any alcoholic beverage   2) You may resume regular meals tomorrow.  Today it is better to start with liquids and gradually work up to solid foods.  You may eat anything you prefer, but it is better to start with liquids, then soup and crackers, and gradually work up to solid foods.   3) Please notify your doctor immediately if you have any unusual bleeding, trouble breathing, redness and pain at the surgery site, drainage, fever, or pain not relieved by medication.    4) Additional Instructions:        Please contact your physician with any problems or Same Day Surgery at [phone], Monday through Friday 6 am to 4 pm, or [place] at [facility] number at [phone].

## 2019-04-28 NOTE — Anesthesia Procedure Notes (Signed)
Procedure Name: LMA Insertion Date/Time: 04/28/2019 7:36 AM Performed by: Jonna Clark, CRNA Pre-anesthesia Checklist: Patient identified, Patient being monitored, Timeout performed, Emergency Drugs available and Suction available Patient Re-evaluated:Patient Re-evaluated prior to induction Oxygen Delivery Method: Circle system utilized Preoxygenation: Pre-oxygenation with 100% oxygen Induction Type: IV induction Ventilation: Mask ventilation without difficulty LMA: LMA inserted LMA Size: 3.5 Tube type: Oral Number of attempts: 1 Placement Confirmation: positive ETCO2 and breath sounds checked- equal and bilateral Tube secured with: Tape Dental Injury: Teeth and Oropharynx as per pre-operative assessment

## 2019-05-07 ENCOUNTER — Other Ambulatory Visit: Payer: Self-pay | Admitting: Internal Medicine

## 2019-05-07 DIAGNOSIS — Z1231 Encounter for screening mammogram for malignant neoplasm of breast: Secondary | ICD-10-CM

## 2019-06-11 ENCOUNTER — Ambulatory Visit
Admission: RE | Admit: 2019-06-11 | Discharge: 2019-06-11 | Disposition: A | Discharge status: Home / Self Care | Payer: BLUE CROSS/BLUE SHIELD | Source: Ambulatory Visit | Attending: Internal Medicine | Admitting: Internal Medicine

## 2019-06-11 DIAGNOSIS — Z1231 Encounter for screening mammogram for malignant neoplasm of breast: Secondary | ICD-10-CM

## 2019-12-17 ENCOUNTER — Other Ambulatory Visit
Admission: RE | Admit: 2019-12-17 | Discharge: 2019-12-17 | Disposition: A | Discharge status: Home / Self Care | Payer: 59 | Source: Ambulatory Visit | Attending: Internal Medicine | Admitting: Internal Medicine

## 2019-12-17 ENCOUNTER — Other Ambulatory Visit: Payer: Self-pay

## 2019-12-17 DIAGNOSIS — Z20822 Contact with and (suspected) exposure to covid-19: Secondary | ICD-10-CM | POA: Insufficient documentation

## 2019-12-17 DIAGNOSIS — Z01812 Encounter for preprocedural laboratory examination: Secondary | ICD-10-CM | POA: Insufficient documentation

## 2019-12-18 ENCOUNTER — Encounter: Payer: Self-pay | Admitting: Internal Medicine

## 2019-12-18 LAB — SARS CORONAVIRUS 2 (TAT 6-24 HRS): SARS Coronavirus 2: NEGATIVE

## 2019-12-21 ENCOUNTER — Ambulatory Visit: Payer: 59 | Admitting: Certified Registered Nurse Anesthetist

## 2019-12-21 ENCOUNTER — Encounter: Payer: Self-pay | Admitting: Internal Medicine

## 2019-12-21 ENCOUNTER — Encounter
Admission: RE | Disposition: A | Discharge status: Home / Self Care | Payer: Self-pay | Source: Home / Self Care | Attending: Internal Medicine

## 2019-12-21 ENCOUNTER — Ambulatory Visit
Admission: RE | Admit: 2019-12-21 | Discharge: 2019-12-21 | Disposition: A | Discharge status: Home / Self Care | Payer: 59 | Attending: Internal Medicine | Admitting: Internal Medicine

## 2019-12-21 ENCOUNTER — Other Ambulatory Visit: Payer: Self-pay

## 2019-12-21 DIAGNOSIS — Z79899 Other long term (current) drug therapy: Secondary | ICD-10-CM | POA: Insufficient documentation

## 2019-12-21 DIAGNOSIS — Z1211 Encounter for screening for malignant neoplasm of colon: Secondary | ICD-10-CM | POA: Insufficient documentation

## 2019-12-21 DIAGNOSIS — F419 Anxiety disorder, unspecified: Secondary | ICD-10-CM | POA: Insufficient documentation

## 2019-12-21 DIAGNOSIS — K219 Gastro-esophageal reflux disease without esophagitis: Secondary | ICD-10-CM | POA: Diagnosis not present

## 2019-12-21 DIAGNOSIS — K635 Polyp of colon: Secondary | ICD-10-CM | POA: Insufficient documentation

## 2019-12-21 DIAGNOSIS — F17200 Nicotine dependence, unspecified, uncomplicated: Secondary | ICD-10-CM | POA: Insufficient documentation

## 2019-12-21 DIAGNOSIS — Z79890 Hormone replacement therapy (postmenopausal): Secondary | ICD-10-CM | POA: Diagnosis not present

## 2019-12-21 DIAGNOSIS — K640 First degree hemorrhoids: Secondary | ICD-10-CM | POA: Diagnosis not present

## 2019-12-21 HISTORY — DX: Localized edema: R60.0

## 2019-12-21 HISTORY — DX: Headache, unspecified: R51.9

## 2019-12-21 HISTORY — DX: Herpesviral infection of urogenital system, unspecified: A60.00

## 2019-12-21 HISTORY — DX: Personal history of suicidal behavior: Z91.51

## 2019-12-21 HISTORY — PX: COLONOSCOPY WITH PROPOFOL: SHX5780

## 2019-12-21 SURGERY — COLONOSCOPY WITH PROPOFOL
Anesthesia: General

## 2019-12-21 MED ORDER — GLYCOPYRROLATE 0.2 MG/ML IJ SOLN
INTRAMUSCULAR | Status: DC | PRN
  Administered 2019-12-21: .1 mg via INTRAVENOUS

## 2019-12-21 MED ORDER — LIDOCAINE HCL (CARDIAC) PF 100 MG/5ML IV SOSY
PREFILLED_SYRINGE | INTRAVENOUS | Status: DC | PRN
  Administered 2019-12-21: 50 mg via INTRAVENOUS

## 2019-12-21 MED ORDER — PROPOFOL 500 MG/50ML IV EMUL
INTRAVENOUS | Status: DC | PRN
  Administered 2019-12-21: 200 ug/kg/min via INTRAVENOUS

## 2019-12-21 MED ORDER — SODIUM CHLORIDE 0.9 % IV SOLN: INTRAVENOUS | Status: DC

## 2019-12-21 MED ORDER — PROPOFOL 10 MG/ML IV BOLUS
INTRAVENOUS | Status: DC | PRN
  Administered 2019-12-21: 30 mg via INTRAVENOUS
  Administered 2019-12-21: 20 mg via INTRAVENOUS
  Administered 2019-12-21: 70 mg via INTRAVENOUS
  Administered 2019-12-21: 30 mg via INTRAVENOUS

## 2019-12-21 MED ORDER — PROPOFOL 500 MG/50ML IV EMUL
INTRAVENOUS | Status: AC
  Filled 2019-12-21: qty 50

## 2019-12-21 MED ORDER — SODIUM CHLORIDE 0.9 % IV SOLN: INTRAVENOUS | Status: DC | PRN

## 2019-12-21 NOTE — Transfer of Care (Signed)
Immediate Anesthesia Transfer of Care Note  Patient: Angelica Hart  Procedure(s) Performed: COLONOSCOPY WITH PROPOFOL (N/A )  Patient Location: PACU and Endoscopy Unit  Anesthesia Type:General  Level of Consciousness: drowsy  Airway & Oxygen Therapy: Patient Spontanous Breathing  Post-op Assessment: Report given to RN and Post -op Vital signs reviewed and stable  Post vital signs: Reviewed and stable  Last Vitals:  Vitals Value Taken Time  BP 104/56 12/21/19 1506  Temp    Pulse 82 12/21/19 1506  Resp 16 12/21/19 1506  SpO2 97 % 12/21/19 1506  Vitals shown include unvalidated device data.  Last Pain:  Vitals:   12/21/19 1506  TempSrc:   PainSc: Asleep         Complications: No complications documented.

## 2019-12-21 NOTE — Op Note (Signed)
[facility] Gastroenterology Patient Name: Angelica Hart Procedure Date: 12/21/2019 2:29 PM MRN: [id number] Account #: [id number] Date of Birth: March 26, 1971 Admit Type: Outpatient Age: 49 Room: [facility] ENDO ROOM 3 Gender: Female Note Status: Finalized Procedure:             Colonoscopy Indications:           Screening for colorectal malignant neoplasm Providers:             Boykin Nearing. Norma Fredrickson MD, MD Referring MD:          Marya Amsler. Dareen Piano MD, MD (Referring MD) Medicines:             Propofol per Anesthesia Complications:         No immediate complications. Procedure:             Pre-Anesthesia Assessment:                        - The risks and benefits of the procedure and the                         sedation options and risks were discussed with the                         patient. All questions were answered and informed                         consent was obtained.                        - Patient identification and proposed procedure were                         verified prior to the procedure by the nurse. The                         procedure was verified in the procedure room.                        - ASA Grade Assessment: II - A patient with mild                         systemic disease.                        - After reviewing the risks and benefits, the patient                         was deemed in satisfactory condition to undergo the                         procedure.                        After obtaining informed consent, the colonoscope was                         passed under direct vision. Throughout the procedure,                         the patient's blood  pressure, pulse, and oxygen                         saturations were monitored continuously. The                         Colonoscope was introduced through the anus and                         advanced to the the terminal ileum, with                         identification of the appendiceal  orifice and IC                         valve. The patient tolerated the procedure well. The                         quality of the bowel preparation was good. The                         terminal ileum, ileocecal valve, appendiceal orifice,                         and rectum were photographed. The colonoscopy was                         technically difficult and complex due to a redundant                         colon and significant looping. Successful completion                         of the procedure was aided by applying abdominal                         pressure. The patient tolerated the procedure well. Findings:      The perianal and digital rectal examinations were normal. Pertinent       negatives include normal sphincter tone and no palpable rectal lesions.      Non-bleeding internal hemorrhoids were found during retroflexion. The       hemorrhoids were Grade I (internal hemorrhoids that do not prolapse).      Three sessile polyps were found in the rectum and sigmoid colon. The       polyps were 3 to 4 mm in size. These polyps were removed with a cold       biopsy forceps. Resection and retrieval were complete.      The terminal ileum appeared normal.      The exam was otherwise without abnormality. Impression:            - Non-bleeding internal hemorrhoids.                        - Three 3 to 4 mm polyps in the rectum and in the                         sigmoid colon, removed with a cold biopsy forceps.  Resected and retrieved.                        - The examination was otherwise normal. Recommendation:        - Patient has a contact number available for                         emergencies. The signs and symptoms of potential                         delayed complications were discussed with the patient.                         Return to normal activities tomorrow. Written                         discharge instructions were provided to the patient.                         - Resume previous diet.                        - Continue present medications.                        - Await pathology results.                        - Repeat colonoscopy is recommended for surveillance.                         The colonoscopy date will be determined after                         pathology results from today's exam become available                         for review.                        - Return to GI office PRN.                        - The findings and recommendations were discussed with                         the patient. Procedure Code(s):     --- Professional ---                        [phone], Colonoscopy, flexible; with biopsy, single or                         multiple Diagnosis Code(s):     --- Professional ---                        K64.0, First degree hemorrhoids                        K63.5, Polyp of colon  K62.1, Rectal polyp                        Z12.11, Encounter for screening for malignant neoplasm                         of colon CPT copyright 2019 [employer]. All rights reserved. The codes documented in this report are preliminary and upon coder review may  be revised to meet current compliance requirements. Stanton Kidney MD, MD 12/21/2019 3:09:41 PM This report has been signed electronically. Number of Addenda: 0 Note Initiated On: 12/21/2019 2:29 PM Scope Withdrawal Time: 0 hours 5 minutes 24 seconds  Total Procedure Duration: 0 hours 21 minutes 36 seconds  Estimated Blood Loss:  Estimated blood loss: none. Estimated blood loss: none.      [facility]

## 2019-12-21 NOTE — Anesthesia Preprocedure Evaluation (Signed)
Anesthesia Evaluation  Patient identified by MRN, date of birth, ID band Patient awake    Reviewed: Allergy & Precautions, H&P , NPO status , Patient's Chart, lab work & pertinent test results, reviewed documented beta blocker date and time   Airway Mallampati: II   Neck ROM: full    Dental  (+) Poor Dentition   Pulmonary neg pulmonary ROS, Current Smoker and Patient abstained from smoking.,    Pulmonary exam normal        Cardiovascular Exercise Tolerance: Good negative cardio ROS Normal cardiovascular exam Rhythm:regular Rate:Normal     Neuro/Psych  Headaches, Anxiety negative psych ROS   GI/Hepatic Neg liver ROS, GERD  Medicated,  Endo/Other  negative endocrine ROS  Renal/GU negative Renal ROS  negative genitourinary   Musculoskeletal   Abdominal   Peds  Hematology negative hematology ROS (+)   Anesthesia Other Findings Past Medical History: No date: Anxiety No date: Edema of both legs No date: Foot pain 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 2015 per pt: BREAST BIOPSY; Left     Comment:  benign 1987: DILATION AND CURETTAGE OF UTERUS 08/30/2009: HERNIA REPAIR     Comment:  Left medial floor weakness repaired with atrium plug and              patch via femoral incision. 2001: VARICOSE VEIN SURGERY BMI    Body Mass Index: 24.41 kg/m     Reproductive/Obstetrics negative OB ROS                              Anesthesia Physical Anesthesia Plan  ASA: II  Anesthesia Plan: General   Post-op Pain Management:    Induction:   PONV Risk Score and Plan:   Airway Management Planned:   Additional Equipment:   Intra-op Plan:   Post-operative Plan:   Informed Consent: I have reviewed the patients History and Physical, chart, labs and discussed the procedure including the risks, benefits and alternatives for the proposed anesthesia with the patient or authorized representative who has indicated his/her understanding and acceptance.     Dental Advisory Given  Plan Discussed with: CRNA  Anesthesia Plan Comments:         Anesthesia Quick Evaluation

## 2019-12-21 NOTE — Interval H&P Note (Signed)
History and Physical Interval Note:  12/21/2019 1:30 PM  Angelica Hart  has presented today for surgery, with the diagnosis of screening colonoscopy.  The various methods of treatment have been discussed with the patient and family. After consideration of risks, benefits and other options for treatment, the patient has consented to  Procedure(s): COLONOSCOPY WITH PROPOFOL (N/A) as a surgical intervention.  The patient's history has been reviewed, patient examined, no change in status, stable for surgery.  I have reviewed the patient's chart and labs.  Questions were answered to the patient's satisfaction.     [place], [place]

## 2019-12-21 NOTE — H&P (Signed)
Outpatient short stay form Pre-procedure 12/21/2019 1:29 PM Angelica Hart Angelica Hart, M.D.  Primary Physician: Angelica Hart, M.D.  Reason for visit: Colon cancer screening  History of present illness:  Patient presents for colonoscopy for colon cancer screening. The patient denies complaints of abdominal pain, significant change in bowel habits, or rectal bleeding.     No current facility-administered medications for this encounter.  Medications Prior to Admission  Medication Sig Dispense Refill Last Dose  . buPROPion (WELLBUTRIN SR) 150 MG 12 hr tablet Take 150 mg by mouth daily.     . DULoxetine (CYMBALTA) 60 MG capsule Take 60 mg by mouth daily.   11   . estradiol (ESTRACE) 1 MG tablet Take 1 mg by mouth daily.      Marland Kitchen HYDROcodone-acetaminophen (NORCO/VICODIN) 5-325 MG tablet Take 1 tablet by mouth every 4 (four) hours as needed for moderate pain. 20 tablet 0   . oxyCODONE (ROXICODONE) 5 MG immediate release tablet Take 1 tablet (5 mg total) by mouth every 4 (four) hours as needed. 20 tablet 0      Allergies  Allergen Reactions  . Codeine Nausea And Vomiting     Past Medical History:  Diagnosis Date  . Anxiety   . Edema of both legs   . Foot pain   . Genital HSV   . GERD (gastroesophageal reflux disease)   . Headache   . Hemorrhoids   . History of suicide attempt   . IBS (irritable bowel syndrome)   . Inguinal hernia 2011  . Varicose veins     Review of systems:  Otherwise negative.    Physical Exam  Gen: Alert, oriented. Appears stated age.  HEENT: [place]/AT. PERRLA. Lungs: CTA, no wheezes. CV: RR nl S1, S2. Abd: soft, benign, no masses. BS+ Ext: No edema. Pulses 2+    Planned procedures: Proceed with colonoscopy. The patient understands the nature of the planned procedure, indications, risks, alternatives and potential complications including but not limited to bleeding, infection, perforation, damage to internal organs and possible oversedation/side effects  from anesthesia. The patient agrees and gives consent to proceed.  Please refer to procedure notes for findings, recommendations and patient disposition/instructions.     Angelica Hart Angelica Hart, M.D. Gastroenterology 12/21/2019  1:29 PM

## 2019-12-23 ENCOUNTER — Encounter: Payer: Self-pay | Admitting: Internal Medicine

## 2019-12-23 LAB — SURGICAL PATHOLOGY

## 2019-12-24 NOTE — Anesthesia Postprocedure Evaluation (Signed)
Anesthesia Post Note  Patient: Perrin Eddleman  Procedure(s) Performed: COLONOSCOPY WITH PROPOFOL (N/A )  Patient location during evaluation: PACU Anesthesia Type: General Level of consciousness: awake and alert Pain management: pain level controlled Vital Signs Assessment: post-procedure vital signs reviewed and stable Respiratory status: spontaneous breathing, nonlabored ventilation, respiratory function stable and patient connected to nasal cannula oxygen Cardiovascular status: blood pressure returned to baseline and stable Postop Assessment: no apparent nausea or vomiting Anesthetic complications: no   No complications documented.   Last Vitals:  Vitals:   12/21/19 1516 12/21/19 1526  BP: 95/62 (!) 115/54  Pulse: 65 (!) 52  Resp: 13 13  Temp:    SpO2: 100% 100%    Last Pain:  Vitals:   12/22/19 0753  TempSrc:   PainSc: 0-No pain                 Yevette Edwards

## 2020-05-25 ENCOUNTER — Other Ambulatory Visit: Payer: Self-pay | Admitting: Internal Medicine

## 2020-05-25 DIAGNOSIS — Z1231 Encounter for screening mammogram for malignant neoplasm of breast: Secondary | ICD-10-CM

## 2020-06-13 ENCOUNTER — Other Ambulatory Visit: Payer: Self-pay

## 2020-06-13 ENCOUNTER — Ambulatory Visit
Admission: RE | Admit: 2020-06-13 | Discharge: 2020-06-13 | Disposition: A | Discharge status: Home / Self Care | Payer: 59 | Source: Ambulatory Visit | Attending: Internal Medicine | Admitting: Internal Medicine

## 2020-06-13 DIAGNOSIS — Z1231 Encounter for screening mammogram for malignant neoplasm of breast: Secondary | ICD-10-CM | POA: Diagnosis present

## 2021-06-05 DIAGNOSIS — M25562 Pain in left knee: Secondary | ICD-10-CM | POA: Diagnosis not present

## 2021-06-05 DIAGNOSIS — M2392 Unspecified internal derangement of left knee: Secondary | ICD-10-CM | POA: Diagnosis not present

## 2021-08-31 ENCOUNTER — Other Ambulatory Visit: Payer: Self-pay | Admitting: Internal Medicine

## 2021-08-31 DIAGNOSIS — Z1231 Encounter for screening mammogram for malignant neoplasm of breast: Secondary | ICD-10-CM

## 2021-09-14 ENCOUNTER — Ambulatory Visit
Admission: RE | Admit: 2021-09-14 | Discharge: 2021-09-14 | Disposition: A | Discharge status: Home / Self Care | Payer: 59 | Source: Ambulatory Visit | Attending: Internal Medicine | Admitting: Internal Medicine

## 2021-09-14 DIAGNOSIS — Z1231 Encounter for screening mammogram for malignant neoplasm of breast: Secondary | ICD-10-CM | POA: Insufficient documentation

## 2021-09-27 DIAGNOSIS — H6982 Other specified disorders of Eustachian tube, left ear: Secondary | ICD-10-CM | POA: Diagnosis not present

## 2022-06-08 DIAGNOSIS — R6889 Other general symptoms and signs: Secondary | ICD-10-CM | POA: Diagnosis not present

## 2022-08-15 ENCOUNTER — Other Ambulatory Visit: Payer: Self-pay | Admitting: Internal Medicine

## 2022-08-15 DIAGNOSIS — Z1231 Encounter for screening mammogram for malignant neoplasm of breast: Secondary | ICD-10-CM

## 2022-09-18 ENCOUNTER — Ambulatory Visit
Admission: RE | Admit: 2022-09-18 | Discharge: 2022-09-18 | Disposition: A | Discharge status: Home / Self Care | Payer: 59 | Source: Ambulatory Visit | Attending: Internal Medicine | Admitting: Internal Medicine

## 2022-09-18 DIAGNOSIS — Z1231 Encounter for screening mammogram for malignant neoplasm of breast: Secondary | ICD-10-CM | POA: Diagnosis not present

## 2022-10-18 DIAGNOSIS — F325 Major depressive disorder, single episode, in full remission: Secondary | ICD-10-CM | POA: Diagnosis not present

## 2022-10-18 DIAGNOSIS — G43009 Migraine without aura, not intractable, without status migrainosus: Secondary | ICD-10-CM | POA: Diagnosis not present

## 2022-10-18 DIAGNOSIS — Z0000 Encounter for general adult medical examination without abnormal findings: Secondary | ICD-10-CM | POA: Diagnosis not present

## 2022-11-26 DIAGNOSIS — Z03818 Encounter for observation for suspected exposure to other biological agents ruled out: Secondary | ICD-10-CM | POA: Diagnosis not present

## 2022-11-26 DIAGNOSIS — B3731 Acute candidiasis of vulva and vagina: Secondary | ICD-10-CM | POA: Diagnosis not present

## 2022-11-26 DIAGNOSIS — J0110 Acute frontal sinusitis, unspecified: Secondary | ICD-10-CM | POA: Diagnosis not present

## 2022-12-07 DIAGNOSIS — R197 Diarrhea, unspecified: Secondary | ICD-10-CM | POA: Diagnosis not present

## 2023-08-13 ENCOUNTER — Other Ambulatory Visit: Payer: Self-pay | Admitting: Internal Medicine

## 2023-08-13 DIAGNOSIS — Z1231 Encounter for screening mammogram for malignant neoplasm of breast: Secondary | ICD-10-CM

## 2023-09-19 ENCOUNTER — Ambulatory Visit
Admission: RE | Admit: 2023-09-19 | Discharge: 2023-09-19 | Disposition: A | Discharge status: Home / Self Care | Source: Ambulatory Visit | Attending: Internal Medicine | Admitting: Internal Medicine

## 2023-09-19 DIAGNOSIS — Z1231 Encounter for screening mammogram for malignant neoplasm of breast: Secondary | ICD-10-CM | POA: Insufficient documentation

## 2023-09-27 ENCOUNTER — Other Ambulatory Visit: Payer: Self-pay | Admitting: Internal Medicine

## 2023-09-27 DIAGNOSIS — R928 Other abnormal and inconclusive findings on diagnostic imaging of breast: Secondary | ICD-10-CM

## 2023-10-02 ENCOUNTER — Ambulatory Visit
Admission: RE | Admit: 2023-10-02 | Discharge: 2023-10-02 | Disposition: A | Discharge status: Home / Self Care | Source: Ambulatory Visit | Attending: Internal Medicine | Admitting: Internal Medicine

## 2023-10-02 ENCOUNTER — Other Ambulatory Visit: Payer: Self-pay | Admitting: Internal Medicine

## 2023-10-02 DIAGNOSIS — R928 Other abnormal and inconclusive findings on diagnostic imaging of breast: Secondary | ICD-10-CM

## 2024-02-20 ENCOUNTER — Other Ambulatory Visit: Payer: Self-pay | Admitting: Internal Medicine

## 2024-02-20 DIAGNOSIS — N6489 Other specified disorders of breast: Secondary | ICD-10-CM

## 2024-04-27 ENCOUNTER — Inpatient Hospital Stay: Admission: RE | Admit: 2024-04-27 | Source: Ambulatory Visit
